# Patient Record
Sex: Male | Born: 1950 | Race: White | Hispanic: No | Marital: Married | Smoking: Never smoker
Health system: Southern US, Community
[De-identification: ages and names within clinical notes are randomized; demographics above are authoritative.]

## PROBLEM LIST (undated history)

## (undated) DIAGNOSIS — H269 Unspecified cataract: Secondary | ICD-10-CM

## (undated) DIAGNOSIS — E079 Disorder of thyroid, unspecified: Secondary | ICD-10-CM

## (undated) DIAGNOSIS — IMO0001 Reserved for inherently not codable concepts without codable children: Secondary | ICD-10-CM

## (undated) DIAGNOSIS — E119 Type 2 diabetes mellitus without complications: Secondary | ICD-10-CM

## (undated) DIAGNOSIS — I8390 Asymptomatic varicose veins of unspecified lower extremity: Secondary | ICD-10-CM

## (undated) DIAGNOSIS — E039 Hypothyroidism, unspecified: Secondary | ICD-10-CM

## (undated) DIAGNOSIS — R7302 Impaired glucose tolerance (oral): Secondary | ICD-10-CM

## (undated) DIAGNOSIS — I2510 Atherosclerotic heart disease of native coronary artery without angina pectoris: Secondary | ICD-10-CM

## (undated) DIAGNOSIS — E785 Hyperlipidemia, unspecified: Secondary | ICD-10-CM

## (undated) DIAGNOSIS — H9311 Tinnitus, right ear: Secondary | ICD-10-CM

## (undated) DIAGNOSIS — IMO0002 Reserved for concepts with insufficient information to code with codable children: Secondary | ICD-10-CM

## (undated) DIAGNOSIS — Z87442 Personal history of urinary calculi: Secondary | ICD-10-CM

## (undated) DIAGNOSIS — M1990 Unspecified osteoarthritis, unspecified site: Secondary | ICD-10-CM

## (undated) DIAGNOSIS — L723 Sebaceous cyst: Secondary | ICD-10-CM

## (undated) DIAGNOSIS — H9190 Unspecified hearing loss, unspecified ear: Secondary | ICD-10-CM

## (undated) HISTORY — PX: FRACTURE SURGERY: SHX138

## (undated) HISTORY — DX: Asymptomatic varicose veins of unspecified lower extremity: I83.90

## (undated) HISTORY — DX: Unspecified osteoarthritis, unspecified site: M19.90

## (undated) HISTORY — PX: JOINT REPLACEMENT: SHX530

## (undated) HISTORY — PX: CARDIAC CATHETERIZATION: SHX172

## (undated) HISTORY — PX: REPAIR PERONEAL TENDONS ANKLE: SUR1201

## (undated) HISTORY — DX: Unspecified hearing loss, unspecified ear: H91.90

## (undated) HISTORY — DX: Disorder of thyroid, unspecified: E07.9

## (undated) HISTORY — DX: Hyperlipidemia, unspecified: E78.5

## (undated) HISTORY — PX: SHOULDER SURGERY: SHX246

## (undated) HISTORY — PX: TOTAL KNEE ARTHROPLASTY: SHX125

## (undated) HISTORY — DX: Unspecified cataract: H26.9

## (undated) HISTORY — DX: Atherosclerotic heart disease of native coronary artery without angina pectoris: I25.10

## (undated) HISTORY — DX: Impaired glucose tolerance (oral): R73.02

## (undated) HISTORY — DX: Reserved for inherently not codable concepts without codable children: IMO0001

## (undated) HISTORY — PX: SHOULDER HARDWARE REMOVAL: SUR1131

## (undated) HISTORY — DX: Reserved for concepts with insufficient information to code with codable children: IMO0002

## (undated) HISTORY — PX: REVISION TOTAL KNEE ARTHROPLASTY: SHX767

---

## 1968-10-24 HISTORY — PX: MULTIPLE TOOTH EXTRACTIONS: SHX2053

## 1998-02-24 ENCOUNTER — Ambulatory Visit (HOSPITAL_BASED_OUTPATIENT_CLINIC_OR_DEPARTMENT_OTHER): Admission: RE | Admit: 1998-02-24 | Discharge: 1998-02-24 | Payer: Self-pay | Admitting: Orthopedic Surgery

## 1998-04-06 ENCOUNTER — Ambulatory Visit (HOSPITAL_COMMUNITY): Admission: RE | Admit: 1998-04-06 | Discharge: 1998-04-06 | Payer: Self-pay | Admitting: *Deleted

## 2000-06-15 ENCOUNTER — Encounter: Payer: Self-pay | Admitting: Orthopedic Surgery

## 2000-06-20 ENCOUNTER — Encounter (INDEPENDENT_AMBULATORY_CARE_PROVIDER_SITE_OTHER): Payer: Self-pay

## 2000-06-20 ENCOUNTER — Ambulatory Visit (HOSPITAL_COMMUNITY): Admission: RE | Admit: 2000-06-20 | Discharge: 2000-06-20 | Payer: Self-pay | Admitting: *Deleted

## 2000-06-21 ENCOUNTER — Inpatient Hospital Stay (HOSPITAL_COMMUNITY): Admission: RE | Admit: 2000-06-21 | Discharge: 2000-06-27 | Payer: Self-pay | Admitting: Orthopedic Surgery

## 2001-04-08 ENCOUNTER — Inpatient Hospital Stay (HOSPITAL_COMMUNITY): Admission: EM | Admit: 2001-04-08 | Discharge: 2001-04-09 | Payer: Self-pay | Admitting: Family Medicine

## 2003-04-16 ENCOUNTER — Encounter: Payer: Self-pay | Admitting: *Deleted

## 2003-04-16 ENCOUNTER — Inpatient Hospital Stay (HOSPITAL_COMMUNITY): Admission: AD | Admit: 2003-04-16 | Discharge: 2003-04-17 | Payer: Self-pay | Admitting: *Deleted

## 2004-06-07 ENCOUNTER — Emergency Department (HOSPITAL_COMMUNITY): Admission: EM | Admit: 2004-06-07 | Discharge: 2004-06-07 | Payer: Self-pay | Admitting: Emergency Medicine

## 2004-06-17 ENCOUNTER — Ambulatory Visit (HOSPITAL_COMMUNITY): Admission: RE | Admit: 2004-06-17 | Discharge: 2004-06-17 | Payer: Self-pay | Admitting: Orthopedic Surgery

## 2004-12-07 ENCOUNTER — Ambulatory Visit: Payer: Self-pay | Admitting: Gastroenterology

## 2004-12-27 ENCOUNTER — Ambulatory Visit: Payer: Self-pay | Admitting: Gastroenterology

## 2005-02-11 ENCOUNTER — Ambulatory Visit: Payer: Self-pay | Admitting: Gastroenterology

## 2006-09-14 ENCOUNTER — Inpatient Hospital Stay (HOSPITAL_COMMUNITY): Admission: EM | Admit: 2006-09-14 | Discharge: 2006-09-16 | Payer: Self-pay | Admitting: Emergency Medicine

## 2006-09-15 ENCOUNTER — Encounter (INDEPENDENT_AMBULATORY_CARE_PROVIDER_SITE_OTHER): Payer: Self-pay | Admitting: Specialist

## 2006-09-15 ENCOUNTER — Encounter (INDEPENDENT_AMBULATORY_CARE_PROVIDER_SITE_OTHER): Payer: Self-pay | Admitting: *Deleted

## 2006-09-22 ENCOUNTER — Ambulatory Visit: Payer: Self-pay | Admitting: Gastroenterology

## 2007-08-28 ENCOUNTER — Emergency Department (HOSPITAL_COMMUNITY): Admission: EM | Admit: 2007-08-28 | Discharge: 2007-08-28 | Payer: Self-pay | Admitting: Emergency Medicine

## 2007-09-11 ENCOUNTER — Ambulatory Visit: Payer: Self-pay

## 2007-09-11 ENCOUNTER — Ambulatory Visit: Payer: Self-pay | Admitting: Cardiology

## 2008-04-14 ENCOUNTER — Ambulatory Visit: Payer: Self-pay | Admitting: Family Medicine

## 2008-04-14 ENCOUNTER — Encounter (INDEPENDENT_AMBULATORY_CARE_PROVIDER_SITE_OTHER): Payer: Self-pay | Admitting: *Deleted

## 2008-04-14 ENCOUNTER — Inpatient Hospital Stay (HOSPITAL_COMMUNITY): Admission: EM | Admit: 2008-04-14 | Discharge: 2008-04-17 | Payer: Self-pay | Admitting: Emergency Medicine

## 2008-04-15 ENCOUNTER — Encounter (INDEPENDENT_AMBULATORY_CARE_PROVIDER_SITE_OTHER): Payer: Self-pay | Admitting: *Deleted

## 2008-04-23 ENCOUNTER — Ambulatory Visit (HOSPITAL_COMMUNITY): Admission: RE | Admit: 2008-04-23 | Discharge: 2008-04-23 | Payer: Self-pay | Admitting: Orthopedic Surgery

## 2008-05-19 ENCOUNTER — Encounter: Admission: RE | Admit: 2008-05-19 | Discharge: 2008-05-19 | Payer: Self-pay | Admitting: Orthopedic Surgery

## 2008-07-25 ENCOUNTER — Inpatient Hospital Stay (HOSPITAL_COMMUNITY): Admission: RE | Admit: 2008-07-25 | Discharge: 2008-07-28 | Payer: Self-pay | Admitting: Orthopedic Surgery

## 2010-06-03 ENCOUNTER — Emergency Department (HOSPITAL_COMMUNITY)
Admission: EM | Admit: 2010-06-03 | Discharge: 2010-06-03 | Payer: Self-pay | Source: Home / Self Care | Admitting: Family Medicine

## 2010-08-04 ENCOUNTER — Encounter: Payer: Self-pay | Admitting: Gastroenterology

## 2010-08-04 ENCOUNTER — Telehealth: Payer: Self-pay | Admitting: Gastroenterology

## 2010-08-05 ENCOUNTER — Encounter (INDEPENDENT_AMBULATORY_CARE_PROVIDER_SITE_OTHER): Payer: Self-pay | Admitting: *Deleted

## 2010-08-12 DIAGNOSIS — E119 Type 2 diabetes mellitus without complications: Secondary | ICD-10-CM

## 2010-08-12 DIAGNOSIS — K219 Gastro-esophageal reflux disease without esophagitis: Secondary | ICD-10-CM | POA: Insufficient documentation

## 2010-08-12 DIAGNOSIS — K449 Diaphragmatic hernia without obstruction or gangrene: Secondary | ICD-10-CM | POA: Insufficient documentation

## 2010-08-12 DIAGNOSIS — E7800 Pure hypercholesterolemia, unspecified: Secondary | ICD-10-CM | POA: Insufficient documentation

## 2010-08-12 DIAGNOSIS — E039 Hypothyroidism, unspecified: Secondary | ICD-10-CM | POA: Insufficient documentation

## 2010-08-12 DIAGNOSIS — K921 Melena: Secondary | ICD-10-CM | POA: Insufficient documentation

## 2010-08-13 DIAGNOSIS — K859 Acute pancreatitis without necrosis or infection, unspecified: Secondary | ICD-10-CM

## 2010-08-17 ENCOUNTER — Ambulatory Visit: Payer: Self-pay | Admitting: Gastroenterology

## 2010-08-17 ENCOUNTER — Encounter (INDEPENDENT_AMBULATORY_CARE_PROVIDER_SITE_OTHER): Payer: Self-pay | Admitting: *Deleted

## 2010-08-17 DIAGNOSIS — R195 Other fecal abnormalities: Secondary | ICD-10-CM

## 2010-08-17 DIAGNOSIS — K7689 Other specified diseases of liver: Secondary | ICD-10-CM

## 2010-08-17 DIAGNOSIS — M129 Arthropathy, unspecified: Secondary | ICD-10-CM | POA: Insufficient documentation

## 2010-08-20 ENCOUNTER — Ambulatory Visit: Payer: Self-pay | Admitting: Gastroenterology

## 2010-08-24 ENCOUNTER — Encounter: Payer: Self-pay | Admitting: Gastroenterology

## 2010-11-25 NOTE — Letter (Signed)
Summary: Patient Notice- Polyp Results  [facility]  [address] [place], [place] [address]   Phone: [phone]  Fax: [phone]        August 24, 2010 MRN: [id number]    [name] [address] [place], [place]  [address]    Dear [name],  I am pleased to inform you that the colon polyp(s) removed during your recent colonoscopy was (were) found to be benign (no cancer detected) upon pathologic examination.  I recommend you have a repeat colonoscopy examination in 10_ years to look for recurrent polyps, as having colon polyps increases your risk for having recurrent polyps or even colon cancer in the future.  Should you develop new or worsening symptoms of abdominal pain, bowel habit changes or bleeding from the rectum or bowels, please schedule an evaluation with either your primary care physician or with me.  Additional information/recommendations:  _x_ No further action with gastroenterology is needed at this time. Please      follow-up with your primary care physician for your other healthcare      needs.  __ Please call [phone] to schedule a return visit to review your      situation.  __ Please keep your follow-up visit as already scheduled.  __ Continue treatment plan as outlined the day of your exam.  Please call us if you are having persistent problems or have questions about your condition that have not been fully answered at this time.  Sincerely,  Mardella Layman MD [facility]  This letter has been electronically signed by your physician.  Appended Document: Patient Notice- Polyp Results letter mailed

## 2010-11-25 NOTE — Procedures (Signed)
Summary: Endoscopy & Pathology   EGD  Procedure date:  09/15/2006  Findings:      Location: [facility]   NAME:  [name], [name]                 ACCOUNT NO.:  [id number]   MEDICAL RECORD NO.:  [id number]          PATIENT TYPE:  INP   LOCATION:  5703                         FACILITY:  [facility]   PHYSICIAN:  Georgiana Spinner, M.D.    DATE OF BIRTH:  May 27, 1951   DATE OF PROCEDURE:  DATE OF DISCHARGE:                                 OPERATIVE REPORT   PROCEDURE:  Upper endoscopy with biopsy.   INDICATIONS:  Abdominal pain.   ANESTHESIA:  Fentanyl 50 mcg, Versed 5 mg.   DESCRIPTION OF PROCEDURE:  With the patient mildly sedated in the left  lateral decubitus position, the Olympus videoscopic endoscope was inserted  in the mouth, passed under direct vision through the esophagus which  appeared normal into the stomach fundus, body, antrum, duodenal bulb were  visualized.  The duodenal bulb proximally and posteriorly appeared somewhat  thickened.  This was photographed and subsequently biopsied.  Second portion  of duodenum appeared normal.  From this point the endoscope was slowly  withdrawn taking circumferential views of duodenal mucosa until the  endoscope had been pulled back into the stomach, placed in retroflexion to  view the stomach from below.  The endoscope was then straightened and  withdrawn taking circumferential views of remaining gastric and esophageal  mucosa.  Subsequently the Olympus videoscopic side-viewing duodenoscope was  then inserted in the mouth.  It too was then passed through the esophagus  into the stomach and subsequently to the duodenum where the thickening of  the duodenum was once again seen and photographed and biopsied on direct  view.  We were able to advance past this to the second portion of the  duodenum.  The ampulla was seen, appeared normal, was not photographed but  was viewed.  The endoscope was then withdrawn.  The patient's vital signs  and pulse oximeter remained stable.  The patient tolerated the procedure  well without apparent complications.   FINDINGS:  Focal thickening of the duodenal bulb, but otherwise an  unremarkable examination.  Biopsies were taken.  Await biopsy report.  The  patient reports clinical improvement since being in the hospital.  Continue  present therapy with PPIs and advance diet as tolerated.  Observe clinical  response.           ______________________________  Georgiana Spinner, M.D.     GMO/MEDQ  D:  09/15/2006  T:  09/15/2006  Job:  [id number]   cc:   [name], M.D.      SP Surgical Pathology - STATUS: Final             By: Morrie Sheldon,       Perform Date: [id number] 00:01  Ordered By: Jarvis Newcomer,            Ordered Date: [phone] 14:39  Facility: [facility]  Department: CPATH  Service Report Text  [facility]   [address]   [place], [place] [address]   [phone]    REPORT OF SURGICAL PATHOLOGY    Case #: [id number]   Patient Name: [name], [name].   Office Chart Number: N/A    MRN: [id number]   Pathologist: Beulah Gandy. Luisa Hart, MD   DOB/Age [birth year] (Age: 60) Gender: M   Date Taken: 09/15/2006   Date Received: 09/15/2006    FINAL DIAGNOSIS    ***MICROSCOPIC EXAMINATION AND DIAGNOSIS***    DUODENAL BIOPSIES: BENIGN GASTRIC TYPE MUCOSA. SEE COMMENT.    COMMENT   The biopsy fragments consist of benign gastric type glandular   mucosa with mild chronic inflammation. 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 No dysplasia or malignancy is identified.   JDP:mw 09/18/06    mw   Date Reported: 09/18/2006 John D. Luisa Hart, MD   *** Electronically Signed Out By JDP ***    Clinical information   (as)    specimen(s) obtained   Duodenum, biopsy    Gross Description   Received in formalin are tan, soft tissue fragments that are   submitted in toto.  Number: Two   Size: 0.1 and 0.2 cm One block (SW:mj 09/15/06)    mj/

## 2010-11-25 NOTE — Procedures (Signed)
Summary: Colonoscopy  Patient: [name] Note: All result statuses are Final unless otherwise noted.  Tests: (1) Colonoscopy (COL)   COL Colonoscopy           DONE     [facility]     [address]     [place], [place]  [address]           COLONOSCOPY PROCEDURE REPORT           PATIENT:  Jeff, Freeman  MR#:  [id number]     BIRTHDATE:  [birth year], 59 yrs. old  GENDER:  male     ENDOSCOPIST:  Vania Rea. Jarold Motto, MD, [facility]     REF. BY:  Robert Bellow, M.D.     PROCEDURE DATE:  08/20/2010     PROCEDURE:  Colonoscopy with biopsy     ASA CLASS:  Class II     INDICATIONS:  FOBT positive stool     MEDICATIONS:   Fentanyl 75 mcg IV, Versed 8 mg IV           DESCRIPTION OF PROCEDURE:   After the risks benefits and     alternatives of the procedure were thoroughly explained, informed     consent was obtained.  Digital rectal exam was performed and     revealed no abnormalities.   The [id number] [id number] endoscope was     introduced through the anus and advanced to the cecum, which was     identified by both the appendix and ileocecal valve, without     limitations.  The quality of the prep was excellent, using     MoviPrep.  The instrument was then slowly withdrawn as the colon     was fully examined.     <<PROCEDUREIMAGES>>           FINDINGS:  Mild diverticulosis was found in the sigmoid to     descending colon segments.  A diminutive polyp was found in the     rectum. COLD BIOPSY REMOVED.   Retroflexed views in the rectum     revealed no abnormalities.    The scope was then withdrawn from     the patient and the procedure completed.           COMPLICATIONS:  None     ENDOSCOPIC IMPRESSION:     1) Mild diverticulosis in the sigmoid to descending colon     segments     2) Diminutive polyp in the rectum     R/O ADENOMA     RECOMMENDATIONS:     1) Await biopsy results     2) Repeat colonoscopy in 5 years if polyp adenomatous; otherwise     10 years     3) High fiber diet.  EGD NEEDED AND PERHAPS PILL CAMERA EXAM.     REPEAT EXAM:  No           ______________________________     Vania Rea. Jarold Motto, MD, [facility]           CC:           n.     eSIGNED:   Vania Rea. Patterson at 08/20/2010 02:32 PM           Francesco Runner, [id number]  Note: An exclamation mark (!) indicates a result that was not dispersed into the flowsheet. Document Creation Date: 08/20/2010 2:32 PM _______________________________________________________________________  (1) Order result status: Final Collection or observation date-time: 08/20/2010 14:22 Requested date-time:  Receipt date-time:  Reported  date-time:  Referring Physician:   Ordering Physician: Sheryn Bison [phone]) Specimen Source:  Source: Launa Grill Order Number: [id number] Lab site:   Appended Document: Colonoscopy 10 year followup  Appended Document: Colonoscopy     Procedures Next Due Date:    Colonoscopy: 07/2020

## 2010-11-25 NOTE — Letter (Signed)
Summary: New Patient letter  [facility]  [address] [place], [place] [address]   Phone: [phone]  Fax: [phone]       08/05/2010 MRN: [id number]  [name] [address] [place], [place]  [address]  Dear [name],  Welcome to the Gastroenterology Division at [facility].    You are scheduled to see Dr.  Jarold Motto on 08/17/2010 at 1:30pm on the 3rd floor at [facility], [address]  We ask that you try to arrive at our office 15 minutes prior to your appointment time to allow for check-in.  We would like you to complete the enclosed self-administered evaluation form prior to your visit and bring it with you on the day of your appointment.  We will review it with you.  Also, please bring a complete list of all your medications or, if you prefer, bring the medication bottles and we will list them.  Please bring your insurance card so that we may make a copy of it.  If your insurance requires a referral to see a specialist, please bring your referral form from your primary care physician.  Co-payments are due at the time of your visit and may be paid by cash, check or credit card.     Your office visit will consist of a consult with your physician (includes a physical exam), any laboratory testing he/she may order, scheduling of any necessary diagnostic testing (e.g. x-ray, ultrasound, CT-scan), and scheduling of a procedure (e.g. Endoscopy, Colonoscopy) if required.  Please allow enough time on your schedule to allow for any/all of these possibilities.    If you cannot keep your appointment, please call [phone] to cancel or reschedule prior to your appointment date.  This allows [place] the opportunity to schedule an appointment for another patient in need of care.  If you do not cancel or reschedule by 5 p.m. the business day prior to your appointment date, you will be charged a $50.00 late cancellation/no-show fee.    Thank you for choosing  [facility] for your medical needs.  We appreciate the opportunity to care for you.  Please visit [place] at our website  to learn more about our practice.                     Sincerely,                                                             The Gastroenterology Division

## 2010-11-25 NOTE — Progress Notes (Signed)
Summary: Colon recall  Phone Note Call from Patient Call back at [facility] [phone]   Caller: [facility] Summary of Call: Pt would like to know when next colon is due.  No recall in IDX.  Last colon in 12/2004.  Chart has been requested. Initial call taken by: Francee Piccolo CMA Duncan Dull),  August 04, 2010 12:18 PM  Follow-up for Phone Call        left message for pt to call back she is at lunch. Follow-up by: Harlow Mares CMA Duncan Dull),  August 05, 2010 1:48 PM  Additional Follow-up for Phone Call Additional follow up Details #1::        patients wife aware he is due 12/2014 i will add it to the flowsheet and idx Additional Follow-up by: Harlow Mares CMA Duncan Dull),  August 05, 2010 4:42 PM     Appended Document: Colon recall patients wife called back and states at his yearly physical with D.r Guest the patient had blood in his stool I advised her that if that is the case he will need an office visit there was only one slot open until December so I have made him an appt for 08/17/2010 and if for some reason they dont need the appt they will call back and cx.

## 2010-11-25 NOTE — Assessment & Plan Note (Signed)
Summary: [name]   History of Present Illness Visit Type: consult Primary GI MD: Sheryn Bison MD FACP FAGA Primary Provider: Robert Bellow, MD Requesting Provider: Robert Bellow, MD Chief Complaint: heme + stool, no visible blood that pt has seen History of Present Illness:   [name] is 60 year old white male who had guaiac positive stools on routine Hemoccult testing. He has mild constipation but denies other gastrointestinal symptoms except for occasional GERD. He had endoscopy and colonoscopy in 2006. Several years ago he had severe abdominal pain and apparently passed a gallstone although previous ultrasounds have been normal. He also has a history of obesity and fatty infiltration of his liver, but review of his labs show recent normal CBC and liver profile.  He is on daily aspirin, metformin, and zetia for hyperlipidemia. He has had bilateral knee replacements in the past. He has a history of noncritical coronary artery disease. He denies current cardiopulmonary or hepatobiliary symptomatology.   GI Review of Systems    Reports abdominal pain, acid reflux, and  heartburn.      Denies belching, bloating, Freeman pain, dysphagia with liquids, dysphagia with solids, loss of appetite, nausea, vomiting, vomiting blood, weight loss, and  weight gain.      Reports diarrhea and  liver problems.     Denies anal fissure, black tarry stools, change in bowel habit, constipation, diverticulosis, fecal incontinence, heme positive stool, hemorrhoids, irritable bowel syndrome, jaundice, light color stool, rectal bleeding, and  rectal pain.    Current Medications (verified): 1)  Crestor 10 Mg Tabs (Rosuvastatin Calcium) .... Take 1/2 Tablet By Mouth At Bedtime 2)  Fish Oil 1200 Mg Caps (Omega-3 Fatty Acids) 3)  Aspirin 81 Mg Tbec (Aspirin) .... Take One By Mouth Once Daily 4)  Metformin Hcl 500 Mg Tabs (Metformin Hcl) .... Take One By Mouth Two Times A Day 5)  Synthroid 150 Mcg Tabs  (Levothyroxine Sodium) .... Take One By Mouth Once Daily 6)  Zetia 10 Mg Tabs (Ezetimibe) .... Take One By Mouth Once Daily  Allergies: 1)  Lipitor  Past History:  Past medical, surgical, family and social histories (including risk factors) reviewed for relevance to current acute and chronic problems.  Past Medical History: Arthritis Diabetes Fatty Liver Disease GERD Hyperlipidemia Hypothyroidism Obesity Pancreatitis  Past Surgical History: Knee Replacement-Bilateral shoulder surgery  Family History: Reviewed history from 08/12/2010 and no changes required. Family History of Diabetes: "everybody" Family History of Heart Disease: Mother, Father  Social History: Reviewed history from 08/12/2010 and no changes required. married 2 boys, 3 girls Patient has never smoked.  Alcohol Use - no Daily Caffeine Use 2 cups/day Illicit Drug Use - no Occupation: fixer-maintenance tech Patient gets regular exercise. walk  Review of Systems       The patient complains of allergy/sinus, arthritis/joint pain, back pain, headaches-new, hearing problems, muscle pains/cramps, swelling of feet/legs, urine leakage, and vision changes.  The patient denies anemia, anxiety-new, blood in urine, breast changes/lumps, confusion, cough, coughing up blood, depression-new, fainting, fatigue, fever, heart murmur, heart rhythm changes, itching, night sweats, nosebleeds, shortness of breath, skin rash, sleeping problems, sore throat, swollen lymph glands, thirst - excessive, urination - excessive, urination changes/pain, and voice change.    Vital Signs:  Patient profile:   60 year old male Height:      71 inches Weight:      271 pounds BMI:     37.93 Pulse rate:   80 / minute Pulse rhythm:   regular BP sitting:  100 / 66  (left arm) Cuff size:   large  Vitals Entered By: Francee Piccolo CMA Duncan Dull) (August 17, 2010 1:23 PM)  Physical Exam  General:  Well developed, well nourished, no acute  distress.healthy appearing.  healthy appearing.   Head:  Normocephalic and atraumatic. Eyes:  PERRLA, no icterus.exam deferred to patient's ophthalmologist.  exam deferred to patient's ophthalmologist.   Lungs:  Clear throughout to auscultation. Heart:  Regular rate and rhythm; no murmurs, rubs,  or bruits. Abdomen:  Soft, nontender and nondistended. No masses, hepatosplenomegaly or hernias noted. Normal bowel sounds.obese.  obese.   Rectal:  deferred until time of colonoscopy.  deferred until time of colonoscopy.   Msk:  joint tenderness, decreased ROM, and arthritic changes.  joint tenderness, decreased ROM, and arthritic changes.   Extremities:  No clubbing, cyanosis, edema or deformities noted.1+ pedal edema.  1+ pedal edema.   Neurologic:  Alert and  oriented x4;  grossly normal neurologically. Skin:  Intact without significant lesions or rashes. Cervical Nodes:  No significant cervical adenopathy. Psych:  Alert and cooperative. Normal mood and affect.   Impression & Recommendations:  Problem # 1:  HEMOCCULT POSITIVE STOOL (ICD-578.1) Assessment Unchanged Colonoscopy scheduled his convenience. He does describe intermittent rectal fissures but no activity at this time. Review of his labs showed normal CBC. Family history is noncontributory.  Problem # 2:  ARTHRITIS (ICD-716.90) Assessment: Improved he denies abuse of NSAIDs and is on aspirin a 81 mg a day.  Problem # 3:  FATTY LIVER DISEASE (ICD-571.8) Assessment: Improved normal LFTs. His  liver does not appear to be significantly enlarged on exam.  Problem # 4:  GERD (ICD-530.81) Assessment: Improved he uses p.r.n. H2 blockers with good symptomatic relief. Previous endoscopy did not show evidence of Barrett's mucosa.  Problem # 5:  DIABETES MELLITUS (ICD-250.00) Assessment: Improved Appropriate change in his diabetic medications made for his colon prep and colonoscopy exam.  Other Orders: Colonoscopy (Colon)  Patient  Instructions: 1)  Copy sent to : Robert Bellow, MD 2)  Your prescription(s) have been sent to you pharmacy.  3)  Your procedure has been scheduled for 08/20/2010, please follow the seperate instructions.  4)   Endoscopy Center Patient Information Guide given to patient.  5)  Colonoscopy and Flexible Sigmoidoscopy brochure given.  6)  The medication list was reviewed and reconciled.  All changed / newly prescribed medications were explained.  A complete medication list was provided to the patient / caregiver. Prescriptions: MOVIPREP 100 GM  SOLR (PEG-KCL-NACL-NASULF-NA ASC-C) As per prep instructions.  #1 x 0   Entered by:   Harlow Mares CMA (AAMA)   Authorized by:   Mardella Layman MD [facility]   Signed by:   Mardella Layman MD [facility] on 08/17/2010   Method used:   Electronically to        [facility] [phone]* (retail)       [address]       [place], [place]  [address]       Ph: [phone] or [phone]       Fax: [phone]   RxID:   [id number]

## 2010-11-25 NOTE — Letter (Signed)
Summary: [facility] Instructions  [facility]  [address] [place], [place] [address]   Phone: [phone]  Fax: [phone]       [name]    1950-12-16    MRN: [id number]       Procedure Day /Date: 08/20/2010 Friday     Arrival Time: 1:00pm     Procedure Time: 2:00pm     Location of Procedure:                    X  [facility] (4th Floor)   PREPARATION FOR COLONOSCOPY WITH MOVIPREP   Starting 5 days prior to your procedure TODAY do not eat nuts, seeds, popcorn, corn, beans, peas,  salads, or any raw vegetables.  Do not take any fiber supplements (e.g. Metamucil, Citrucel, and Benefiber).  THE DAY BEFORE YOUR PROCEDURE         DATE: 08/19/2076  DAY: Thursday  1.  Drink clear liquids the entire day-NO SOLID FOOD  2.  Do not drink anything colored red or purple.  Avoid juices with pulp.  No orange juice.  3.  Drink at least 64 oz. (8 glasses) of fluid/clear liquids during the day to prevent dehydration and help the prep work efficiently.  CLEAR LIQUIDS INCLUDE: Water Jello Ice Popsicles Tea (sugar ok, no milk/cream) Powdered fruit flavored drinks Coffee (sugar ok, no milk/cream) Gatorade Juice: apple, white grape, white cranberry  Lemonade Clear bullion, consomm, broth Carbonated beverages (any kind) Strained chicken noodle soup Hard Candy                             4.  In the morning, mix first dose of MoviPrep solution:    Empty 1 Pouch A and 1 Pouch B into the disposable container    Add lukewarm drinking water to the top line of the container. Mix to dissolve    Refrigerate (mixed solution should be used within 24 hrs)  5.  Begin drinking the prep at 5:00 p.m. The MoviPrep container is divided by 4 marks.   Every 15 minutes drink the solution down to the next mark (approximately 8 oz) until the full liter is complete.   6.  Follow completed prep with 16 oz of clear liquid of your choice (Nothing red or purple).  Continue  to drink clear liquids until bedtime.  7.  Before going to bed, mix second dose of MoviPrep solution:    Empty 1 Pouch A and 1 Pouch B into the disposable container    Add lukewarm drinking water to the top line of the container. Mix to dissolve    Refrigerate  THE DAY OF YOUR PROCEDURE      DATE: 08/20/2010  DAY: Friday  Beginning at 9:00am (5 hours before procedure):         1. Every 15 minutes, drink the solution down to the next mark (approx 8 oz) until the full liter is complete.  2. Follow completed prep with 16 oz. of clear liquid of your choice.    3. You may drink clear liquids until 12:00(noon) (2 HOURS BEFORE PROCEDURE).   MEDICATION INSTRUCTIONS  Unless otherwise instructed, you should take regular prescription medications with a small sip of water   as early as possible the morning of your procedure.  Diabetic patients - see separate instructions.      OTHER INSTRUCTIONS  You will need a responsible adult at least 62  years of age to accompany you and drive you home.   This person must remain in the waiting room during your procedure.  Wear loose fitting clothing that is easily removed.  Leave jewelry and other valuables at home.  However, you may wish to bring a book to read or  an iPod/MP3 player to listen to music as you wait for your procedure to start.  Remove all body piercing jewelry and leave at home.  Total time from sign-in until discharge is approximately 2-3 hours.  You should go home directly after your procedure and rest.  You can resume normal activities the  day after your procedure.  The day of your procedure you should not:   Drive   Make legal decisions   Operate machinery   Drink alcohol   Return to work  You will receive specific instructions about eating, activities and medications before you leave.    The above instructions have been reviewed and explained to me by   _______________________    I fully understand  and can verbalize these instructions _____________________________ Date _________

## 2010-11-25 NOTE — Procedures (Signed)
Summary: Colonoscopy   Colonoscopy  Procedure date:  12/27/2004  Findings:      Location:  [facility].   Patient Name: [name], [name] MRN:  Procedure Procedures: Colonoscopy CPT: [phone].  Personnel: Endoscopist: Vania Rea. Jarold Motto, MD.  Exam Location: Exam performed in Outpatient Clinic. Outpatient  Patient Consent: Procedure, Alternatives, Risks and Benefits discussed, consent obtained, from patient. Consent was obtained by the RN.  Indications Symptoms: Abdominal pain / bloating.  History  Current Medications: Patient is not currently taking Coumadin.  Pre-Exam Physical: Performed Dec 27, 2004. Cardio-pulmonary exam, Rectal exam, Abdominal exam, Extremity exam, Mental status exam WNL.  Exam Exam: Extent of exam reached: Cecum, extent intended: Cecum.  The cecum was identified by appendiceal orifice and IC valve. Patient position: on left side. Duration of exam: 20 minutes. Colon retroflexion performed. Images taken. ASA Classification: II. Tolerance: excellent.  Monitoring: Pulse and BP monitoring, Oximetry used. Supplemental O2 given. at 2 Liters.  Colon Prep Used Golytely for colon prep. Prep results: excellent.  Sedation Meds: Patient assessed and found to be appropriate for moderate (conscious) sedation. Fentanyl 75 mcg. given IV. Versed 9 mg. given IV.  Instrument(s): CF 140L. Serial [id number].  Findings - NORMAL EXAM: Cecum to Rectum. Not Seen: Polyps. AVM's. Colitis. Tumors. Melanosis. Crohn's. Diverticulosis. Hemorrhoids.   Assessment Normal examination.  Events  Unplanned Interventions: No intervention was required.  Plans Medication Plan: Referring Shaylea Ucci to order medications.  Disposition: After procedure patient sent to recovery. After recovery patient sent home.  Scheduling/Referral: EGD, to [facility]. Jarold Motto, MD, on Dec 27, 2004.   Comments: RUQ pain seems to be associated with a fatty liver Dx.   CC: [name], MD     Rosalyn Gess. Norins, MD     Gaynelle Arabian, MD  This report was created from the original endoscopy report, which was reviewed and signed by the above listed endoscopist.

## 2010-11-25 NOTE — Letter (Signed)
Summary: Diabetic Instructions  [facility]  [address] [place], [place] [address]   Phone: [phone]  Fax: [phone]    [name] July 06, 1951 MRN: [id number]   X   ORAL DIABETIC MEDICATION INSTRUCTIONS   The day before your procedure:   Take your diabetic pill as you do normally  The day of your procedure:   Do not take your diabetic pill    We will check your blood sugar levels during the admission process and again in Recovery before discharging you home

## 2011-01-05 LAB — GLUCOSE, CAPILLARY
Glucose-Capillary: 92 mg/dL (ref 70–99)
Glucose-Capillary: 92 mg/dL (ref 70–99)

## 2011-03-08 NOTE — Procedures (Signed)
[facility]                              EXERCISE TREADMILL   [name], [name]                        MRN:          [id number]  DATE:09/11/2007                            DOB:          November 16, 1950    Mr. [name] is a 60 year old gentleman with nonobstructive coronary  disease.  He had a catheterization in 2004 which showed a 60% followed  by a 50% LAD, 20% proximal right coronary artery, 40% and a PDA off his  right coronary vessel with an EF of 50%.   He had an episode of vertigo and dizziness with some sweating at work.  He thought it was something he ate.  He has been set up for a treadmill  after giving this history to Dr. Perrin Maltese.   His blood pressure today was 122/78, pulse 79 and regular.   He exercised on the standard Bruce protocol.  He went from 8-1/2  minutes, despite knee problems.  He had no ST segment changes.  He had  no symptoms of angina or ischemia.  His blood pressure increased to  192/51.  It came down nicely in recovery.   His peak heart rate was 142 beats per minute, which is 87% of predicted  maximum heart rate.  He did 5.5 mets.   ASSESSMENT:  1. Negative adequate exercise treadmill.  2. Relatively good exercise tolerance.  3. Mildly exaggerated blood pressure response to exercise.   The results were shared with the patient.  He will continue with medical  therapy.     Thomas C. Daleen Squibb, MD, [facility]  Electronically Signed    TCW/MedQ  DD: 09/11/2007  DT: 09/12/2007  Job #: [id number]   cc:   [name], M.D.

## 2011-03-08 NOTE — Op Note (Signed)
NAMEFAIZ, WEBER                 ACCOUNT NO.:  [id number]   MEDICAL RECORD NO.:  [id number]          PATIENT TYPE:  INP   LOCATION:  5037                         FACILITY:  [facility]   PHYSICIAN:  Madlyn Frankel. Charlann Boxer, M.D.  DATE OF BIRTH:  Sep 26, 1951   DATE OF PROCEDURE:  07/25/2008  DATE OF DISCHARGE:                               OPERATIVE REPORT   PREOPERATIVE DIAGNOSIS:  Left knee osteoarthritis.   POSTOPERATIVE DIAGNOSIS:  Left knee osteoarthritis.   PROCEDURE:  Left total knee replacement.   IMPLANTS:  DePuy rotating platform posterior-stabilized knee system with  a size 5 femur, 4 tibia, 12.5 insert to match the 5 femur and a 41  patellar button.   SURGEON:  Madlyn Frankel. Charlann Boxer, MD   ASSISTANT:  Yetta Glassman. Mann, PA   ANESTHESIA:  General plus regional femoral block placed preoperatively.   COMPLICATIONS:  None.   DRAINS:  X1 medium Hemovac.   TOURNIQUET TIME:  45 minutes at 250 mmHg.   INDICATIONS FOR PROCEDURE:  Mr. Strohmeier is a 60 year old gentleman who was  initially seen and evaluated for painful right total knee replacement.  He had progressive worsening of his left knee with evidence  radiographically of end-stage degenerative changes.  Given his failure  to respond to conservative measures, he wished to discuss surgical  intervention.  Despite having some discomfort with his right knee, his  left knee was the most prominent source of pain.  He wished to go ahead  and proceed with total knee replacement.  Risks and benefits were  reviewed including the risks of infection, DVT, as well as persistent  discomfort, limited range of motion and need for revision surgery.  Consent was obtained.   PROCEDURE IN DETAIL:  The patient was brought to the operative theater.  Once adequate anesthesia and preoperative antibiotics, 2 g of Ancef, the  patient was positioned supine with a left thigh tourniquet placed.  Left  lower extremity was pre-scrubbed and prepped and draped in the  sterile  fashion.   Leg was exsanguinated.  Tourniquet was elevated to 250 mmHg.  A midline  incision was made followed by a median arthrotomy.  Following initial  debridement of the meniscus and synovium, attention was directed to the  patella.  Precut measurement was to 24 mm.  I resected 14 mm and used a  41 patellar button, which fit nicely along the cut surface.   The trial patella was kept in place to prevent any injury to the cut  surface of the patella from retractors of the saw.   Attention was now directed to the femur.  The femoral canal was opened  with the drill, irrigated to prevent fat emboli.  An intramedullary rod  was then passed.  Based on preoperative flexion contracture, I chose to  resect 12 mm of bone off the distal femur.   I did this at 3 degrees of valgus.   Following the distal femoral cut, attention was directed to the tibia.  The tibia was subluxated anteriorly.  Further meniscectomies and  cruciate stumps were debrided as the tibia  was delivered anteriorly.  An  extramedullary guide was used to resect 10 mm of bone off the proximal  tibia using extramedullary device.   I checked the cut surface to make sure it was perpendicular both planes.   At this point, attention was redirected back to femur where femoral  rotation was set off the proximal tibial cut.  The rotational block was  placed with a 3-degree valgus rod placed.  Using the anterior stylus,  the block was pinned in position based on the rotation of the  perpendicular cut.   The 41 cutting block was then pinned into position, checked with a crab  claw to make sure there was no evidence of any notching.  The anterior,  posterior, and chamfer cuts were then made without difficulty nor  notching.  Final box cut was made off the lateral aspect of the distal  femur.   The attention was now redirected back to the tibia.  The tibia was  subluxed anteriorly.  Based on osteophytes from proximal  medial tibia, I  chose to use a size 4 femur slightly lateralizing it to get the rotation  that was appropriate.  It was pinned into position.  Further debridement  of medial osteophytes was carried out.   With the trial in place, it was drilled, keel punched and a trial  reduction was carried out with 5 femur, 4 tibia, and size 5 x 12.5  insert.  The knee came to full extension and appeared to stable from  extension to flexion.   The patella was noted to track without any lift-off or without  application of any pressure.   Trial components were removed.  The synovial capsule junction was  injected with 0.25% Marcaine with epinephrine and 1 mL of Toradol, 61 mL  total.   The knee was then irrigated with pulse lavage and normal saline solution  as the cement was mixed.  The final components were then cemented into  position.  The knee was brought to extension while cement cured and  excessive cement removed.   Once the cement cured, excessive cement was removed throughout the knee  posteriorly particularly.  Once I satisfied, there was none visible, the  knee was re-irrigated and the final 5 x 12.5 insert was placed.  The  knee was then irrigated again.  Medium Hemovac drain was placed deep  using #1 Vicryl, reapproximated extensor mechanism  with the knee in flexion.  The remaining wound was closed in layers with  2-0 Vicryl in running 4-0 Monocryl.  The knee was cleaned, dried, and  dressed sterilely with a sterile bulky wrap.  He was brought to the  recovery room in stable condition.      Madlyn Frankel Charlann Boxer, M.D.  Electronically Signed     MDO/MEDQ  D:  07/25/2008  T:  07/26/2008  Job:  [id number]

## 2011-03-08 NOTE — H&P (Signed)
NAMEODEAN, FESTER NO.:  [id number]   MEDICAL RECORD NO.:  [id number]          PATIENT TYPE:  INP   LOCATION:  5120                         FACILITY:  [facility]   PHYSICIAN:  Nestor Ramp, MD        DATE OF BIRTH:  1951-08-31   DATE OF ADMISSION:  04/14/2008  DATE OF DISCHARGE:                              HISTORY & PHYSICAL   CHIEF COMPLAINT:  Abdominal pain.   PRIMARY CARE PHYSICIAN:  [facility].   HISTORY OF PRESENT ILLNESS:  This is a 60 year old gentleman who comes  to the emergency room with abdominal pain.  He and his wife state that  he has had off-and-on abdominal pain for the past couple of months.  However, the pain became severe Saturday and worsened on Sunday.  He has  been unable to eat or drink anything since Sunday afternoon.  The  abdominal pain is very strong, ringing sensation like he said someone is  turning inside out.  He says he never fell landing like this before.  He  states the pain is located throughout the stomach, but worse in the  upper mid area.  He has had right upper quadrant pain before with a  negative workup.  He has had nausea, but no vomiting.  He has had a  couple episodes of mild diarrhea.  No blood or tarry stools.  He has had  subjective fevers over the weekend, that has not taken his temperature.  He has had chills as well.  Of note, he does not drink alcohol.  The  patient was seen at [facility] earlier today where they felt he  had acute abdomen and sent him to the emergency room immediately.   PAST MEDICAL HISTORY:  1. He has coronary artery disease, nonobstructing with a normal stress      test in November 2008 .  2. He has osteoarthritis as well as hyperlipidemia and hypothyroidism.   MEDICATIONS:  He is on a cholesterol medication, unknown name and dose.  He takes Synthroid 150 mcg p.o. daily.  He takes Tylenol Extra Strength  for arthritis.   ALLERGIES:  Allergic to CRESTOR and LIPITOR.   FAMILY HISTORY:   There is no gallbladder or pancreatic disease in the  family; however, there is hypertension and diabetes throughout the  family.   SOCIAL HISTORY:  He is married.  He lives in [place].  He works at  [employer]  He denies alcohol use and never has drank.  He  denies smoking, denies drug use.   SURGICAL HISTORY:  He has had right knee replacement as well as  bilateral shoulder surgery.   REVIEW OF SYSTEMS:  As in HPI with the following additions.  Complains  of occasional headache.  Denies vision changes, denies chest pain,  denies shortness of breath, denies edema, denies rashes, denies  weakness, denies dysuria.  Remaining review of systems is negative.   PHYSICAL EXAMINATION:  VITAL SIGNS:  He is currently afebrile.  Heart  rate ranges from 60s to the 80s, his blood pressure 140s over  70s and  then 120s over 30s, his oxygen is 95 to 100 on room air.  GENERAL EXAM:  He is not in acute distress, although he is diaphoretic.  HEENT:  Pupils equal, round, react to light and accommodation.  Extraocular muscles intact.  No scleral icterus.  TMs are clear  bilaterally.  He has somewhat dry mucous membranes, but no erythema of  his throat.  NECK:  No JVD.  CARDIOVASCULAR:  Regular rate and rhythm.  No rubs, gallops, or murmurs.  PULMONARY:  Clear to auscultation bilaterally.  ABDOMEN:  Soft with a mild-to-moderate tenderness to palpation  diffusely, worse in the epigastric area.  No rebound, no guarding.  Normal bowel sounds.  EXTREMITIES:  He has 1+ nonpitting pedal edema bilaterally.  DP pulses 2+ bilaterally and equal.  NEURO EXAM:  Cranial nerves 2 through 12 intact.  Oriented x3.  No  asterixis.  SKIN:  No rashes or lesions noted.   LABORATORY DATA:  He has an elevated white count of 18.8, hemoglobin  15.4, hematocrit 44, platelets 290, neutrophils 79, ANC 14.9.  Amylase  is elevated at 351.  Lipase is elevated at 268.  LDH is normal at 235.  INR is normal at 1.   Urinalysis is within normal limits.  Point of care  enzymes are negative.  Comprehensive metabolic panel shows sodium 134,  glucose of 122, AST 20, ALT 22, calcium 8.6, creatinine is 0.85.   CAT scan of his abdomen and pelvis shows a slight haziness around the  pancreatic head and second portion of duodenum.  Question early focal  pancreatitis, recommend correlation with enzymes.  There is a normal  appendix.  There was also fatty infiltration of the liver.   ASSESSMENT:  This is a 60 year old man with pancreatitis.   PROBLEM:  1. Pancreatitis:  This is classified as mild pancreatitis with a      ransom score of 2 with a low mortality rate.  We will treat the      patient by keeping him n.p.o.  I have hydrated him aggressively      with D5 normal saline at 200 mL an hour.  We will treat his pain      with morphine.  We will check labs in the morning such as CBC and      BMET which will help [place] further quantify his ransom score after 48      hours.  We will follow the patient clinically.  No imaging is      needed at this point.  However, we may get abdominal ultrasound to      evaluate for gallstones as well as stones in the head of the      pancreas.  The etiology of this patient's pancreatitis is likely      unknown etiology with a microlithiasis versus hypertriglyceridemia.      We will get a fasting lipid panel in the morning to evaluate his      triglycerides.  The patient has no alcohol history and CT did not      show gallstones; however, they could be seen on ultrasound.      However, this is unlikely given his normal bilirubin of 1.  2. Hyperlipidemia:  We did not know the name of his medication.  We      will not resume any cholesterol meds at this point.  However, we      will obtain records from [facility].  3.  Hypothyroidism:  We will continue his Synthroid as home dose.  4. Prophylaxis:  We will put him on PPI and Lovenox.  5. Disposition:  Disposition will  depend on how quickly the patient's      pancreatitis resolves.     Johney Maine, M.D.  Electronically Signed      Nestor Ramp, MD  Electronically Signed   JT/MEDQ  D:  04/14/2008  T:  04/15/2008  Job:  [id number]

## 2011-03-08 NOTE — H&P (Signed)
[name], [name]                 ACCOUNT NO.:  [id number]   MEDICAL RECORD NO.:  [id number]          PATIENT TYPE:  INP   LOCATION:                               FACILITY:  [facility]   PHYSICIAN:  Madlyn Frankel. Charlann Boxer, M.D.  DATE OF BIRTH:  1951-02-19   DATE OF ADMISSION:  07/25/2008  DATE OF DISCHARGE:                              HISTORY & PHYSICAL   PROCEDURE:  Left total knee replacement.   CHIEF COMPLAINT:  Left knee pain.   HISTORY OF PRESENT ILLNESS:  A 60 year old male with a history of left  knee pain secondary to osteoarthritis.  He has been refractory to all  conservative treatment.  He has been presurgically assessed by Dr. Perrin Maltese  for a left total knee replacement.   PAST MEDICAL HISTORY:  Significant for:  1. Osteoarthritis.  2. Hypercholesterolemia.  3. Recurrent urinary tract infections.   PAST SURGICAL HISTORY:  None.   FAMILY HISTORY:  Coronary artery disease and diabetes.   SOCIAL HISTORY:  Married.  Primary care giver will be family in the home  postoperatively.   DRUG ALLERGIES:  No known drug allergies.   MEDICATIONS:  1. Oxycodone 5 mg 1 p.o. q.4-6 h. p.r.n. pain.  2. Zetia 10 mg p.o. daily.  3. Synthroid 150 mcg p.o. daily.  4. Tylenol extra strength daily.  5. Tylenol PM p.r.n.  6. Cinnamon daily.  7. Omega 3 fish oil 1000 mg daily.  8. Vitamin D 3000 units p.o. daily.  9. Celebrex 200 mg 1 p.o. b.i.d. x2 weeks after surgery.   REVIEW OF SYSTEMS:  See HPI.   PHYSICAL EXAMINATION:  VITAL SIGNS:  Pulse 72, respirations 16, and  blood pressure 124/80.  GENERAL:  Awake, alert, and oriented, well developed, well nourished, in  no acute distress.  NECK:  Supple.  No carotid bruits.  CHEST:  Lungs clear to auscultation bilaterally.  BREASTS:  Deferred.  HEART:  Regular rate and rhythm.  S1 and S2 distinct.  ABDOMEN:  Soft, nontender, and nondistended.  Bowel sounds present.  GENITOURINARY:  Deferred.  EXTREMITIES:  Left knee 0-120 degrees, diffuse  tenderness.  SKIN:  No cellulitis.  NEUROLOGIC:  Intact distal sensibilities.   LABORATORY DATA:  EKG and chest x-ray are pending presurgical testing.   IMPRESSION:  Left knee osteoarthritis.   PLAN OF ACTION:  Left total knee replacement at [facility] on  July 25, 2008, by surgeon Dr. Durene Romans.  Risk and complications  were discussed.   Postoperative medications including Lovenox, Robaxin, iron, aspirin,  Colace, MiraLax provided at the time of history and physical.  Celebrex  will be utilized perioperatively as well.     ______________________________  Yetta Glassman. [address], [place]      Madlyn Frankel. Charlann Boxer, M.D.  Electronically Signed    BLM/MEDQ  D:  07/21/2008  T:  07/22/2008  Job:  [id number]   cc:   [name], M.D.

## 2011-03-08 NOTE — Discharge Summary (Signed)
NAMEHERSHELL, BRANDL NO.:  [id number]   MEDICAL RECORD NO.:  [id number]          PATIENT TYPE:  INP   LOCATION:  5120                         FACILITY:  [facility]   PHYSICIAN:  Nestor Ramp, MD        DATE OF BIRTH:  12-16-1950   DATE OF ADMISSION:  04/14/2008  DATE OF DISCHARGE:  04/17/2008                               DISCHARGE SUMMARY   REASON FOR HOSPITALIZATION:  Abdominal pain.   PRIMARY CARE Angeliz Settlemyre:  [name], M.D. at [facility].   DISCHARGE DIAGNOSES:  1. Mild pancreatitis.  2. History of nonobstructive coronary artery disease with a normal      stress test in November 2008.  3. Osteoarthritis.  4. Hyperlipidemia.  5. Hypothyroidism.   DISCHARGE MEDICATIONS:  1. Percocet 5/325 mg, 1-2 tablets every 4-6 hours as needed for pain.  2. Synthroid 150 mcg p.o. daily.  3. Cholesterol medicine as previously prescribed by Dr. Perrin Maltese.  4. The patient is to avoid taking Tylenol while taking Percocet.   DISCHARGE INSTRUCTIONS:  1. The patient is to follow up with [facility] on Monday the      next week.  2. The patient is to call [facility] for followup      appointment and to inquire about his account with them.  3. The patient is to ask Dr. Perrin Maltese for referral to another      gastroenterologist if necessary.  4. The patient is to drink plenty of fluids and increase solid intake      slowly.   CONSULTATIONS:  None.   PROCEDURES:  None.   SIGNIFICANT FINDINGS:  1. Imaging, CT scan performed on April 14, 2008, showed slight haziness      around the pancreatic head and second portion of duodenum      consistent with possible early focal pancreatitis.  This CT scan is      consistent with previous CT scan performed in November 2007.  2. Right upper quadrant ultrasound performed on April 15, 2008, shows a      normal gallbladder, a right renal cyst, and echogenic liver      parenchyma without a focal lesion.   LABORATORY FINDINGS:  Lipase on admission was elevated at 268.  On date  of discharge, lipase was normal at 49, it was also normal on the day  prior to discharge at 57.  CBC performed on admission showed a white  blood cell count of 21.7, hemoglobin of 14.0, and a platelet count of  271.  CBC on the date of discharge showed white blood cell count of  15.1, hemoglobin 13.5, and platelet count of 248.  Lipid profile  performed during this hospitalization showed a total cholesterol of 178,  triglycerides 109, HDL 44, and LDL 112.  LFTs performed on the date of  discharge were completely within normal limits except for slightly low  albumin at 2.8.   BRIEF HOSPITAL COURSE:  The patient is a 60 year old male with history  of chronic epigastric abdominal pain who came in for abdominal pain  to  the emergency department.  CT scan showed haziness around the pancreatic  head and findings consistent with early pancreatitis.  Lipase was also  elevated at about 200.  Therefore, the patient was admitted for  management of mild pancreatitis.  CT scan performed during this  admission was similar to a previous CT scan performed in 2007.  At that  time, lipase was found to be normal.  At this time, lipase was found to  be elevated.  The patient was made n.p.o. and given aggressive IV fluid  rehydration.  The patient had worsening pain after admission and  required IV Dilaudid to manage his pain.  On April 16, 2008, the  patient's pain was improved.  He was transitioned to oral Percocet  without any difficulty.  The patient was allowed to take liquids by  mouth and tolerated these without difficulty.  IV fluids were decreased  and the patient continued to advance his diet without any further  problems.  On April 16, 2008, lipase came down to the normal range and  remained normal.  On the date of discharge, the patient was tolerating  liquids without difficulty.  On the date of discharge, his abdominal  pain  was controlled on oral pain medicines.  Right upper quadrant  ultrasound performed during this hospitalization was grossly normal.  Given that the patient's symptoms had improved significantly and lipase  had come down to normal range, he was deemed fit for discharge on April 17, 2008.  We advised that the patient should follow up gastroenterology  as an outpatient. We called their office to arrange his appointment but  they stated that his account had been blocked due to credit issues.  We  would recommend that Dr. Perrin Maltese follow this up and make another referral  to gastroenterology as indicated.  The patient was discharged to home in  stable condition on April 17, 2008.   DISCHARGE CONDITION:  Stable.   DISPOSITION:  Discharged to home.   ISSUES FOR FOLLOWUP:  The patient's pain control should be assessed at  outpatient follow up with Dr. Perrin Maltese at [facility].  Referral  should also be considered for outpatient gastroenterology consult, as  the abdominal pain has been a chronic issue for this patient, and the  care of a gastroenterologist could be indicated.      Myrtie Soman, MD  Electronically Signed      Nestor Ramp, MD  Electronically Signed    TE/MEDQ  D:  04/17/2008  T:  04/18/2008  Job:  [id number]   cc:   [name], M.D.

## 2011-03-11 NOTE — Procedures (Signed)
[facility]  Patient:    [name], [name]                        MRN: [id number] Proc. Date: 06/21/00 Adm. Date:  [id number] Attending:  Carolan Shiver Ii CC:         Anesthesia Department                           Procedure Report  I was consulted by Dr. Carlisle Beers. Rendall III to provide postoperative pain relief for his patient, [name].  We decided at this stage to perform an epidural catheter placement and postoperative monitoring by the anesthesiologist.  The risks and benefits of the procedure were discussed in the preoperative period with the patient.  He was aware of the risks and benefits and agreeable to the procedure.  At the completion of the operative procedure, the patient was turned into the right lateral decubitus position.  The back was prepped x 3 from a sterile epidural tray.  A 17-gauge Tuohy needle was used in a paramedian approach at the L2-3 interspace.  A loss of resistance technique with air was used.  The epidural space was located easily on the first attempt.  A Burron catheter was passed 7 cm into the epidural space.  The needle was removed without apparent movement of the catheter.  The catheter was aspirated, there was no evidence of cerebral spinal fluid or blood.  The catheter was injected with solution containing 5 cc of 1% lidocaine plain and 10 cc of sterile preservative-free normal saline. Catheter injected easily, repeat aspiration revealed no evidence of cerebral spinal fluid or blood.  Catheter was taped securely in place.  Patient was placed in the supine position and he emerged uneventfully from the anesthetic.  The patient was brought to the recovery room in good condition. DD:  06/21/00 TD:  06/21/00 Job: [id number]

## 2011-03-11 NOTE — H&P (Signed)
NAMEGORDEN, STTHOMAS                 ACCOUNT NO.:  [id number]   MEDICAL RECORD NO.:  [id number]          PATIENT TYPE:  INP   LOCATION:  0101                         FACILITY[facility]  [facility]   PHYSICIAN:  Corinna L. Lendell Caprice, MDDATE OF BIRTH:  Nov 30, 1950   DATE OF ADMISSION:  09/14/2006  DATE OF DISCHARGE:                              HISTORY & PHYSICAL   CHIEF COMPLAINT:  Abdominal pain.   HISTORY OF PRESENT ILLNESS:  Mr. Kaeser is a 60 year old unassigned white  male who presents to the emergency room with right-sided abdominal pain.  He was seen earlier today at urgent care and sent to the emergency room.  He has had no vomiting or diarrhea.  The pain has been waxing and waning  for the past 3 days.  He has had subjective fevers.  His appetite has  been good.  He has no sick contacts.  His bowel movements have been  normal.  He feels better after Dilaudid.   PAST MEDICAL HISTORY:  Hypothyroidism, non-critical LAD disease,  osteoarthritis.   MEDICATIONS:  1. Skelaxin as needed.  2. Synthroid 150 mcg daily.  3. Aspirin 81 mg a day.   ALLERGIES:  HE IS ALLERGIC TO CRESTOR AND LIPITOR WHICH CAUSES STOMACH  CRAMPS.   SOCIAL HISTORY:  He works in Production designer, theatre/television/film.  He does not smoke, drink, or  use drugs.  He is here with his wife.   FAMILY HISTORY:  His mother and father both had heart disease in their  68s.  Multiple immediate family members with diabetes.  No cancer.  No  previous abdominal surgery.   REVIEW OF SYSTEMS:  As above, otherwise negative.   PHYSICAL EXAMINATION:  Temperature 97 degrees, blood pressure 156/90,  pulse 86, respiratory rate 20, oxygen saturation 97% on room air.  GENERAL:  Patient is an overweight white male in no acute distress.  HEENT:  Normocephalic, atraumatic.  Pupils equal, round, and reactive to  light.  Sclerae are nonicteric.  Moist mucous membranes.  NECK:  Supple.  No lymphadenopathy.  LUNGS:  Clear to auscultation bilaterally without wheezes,  rhonchi or  rales.  CARDIOVASCULAR:  Regular rate, rhythm without murmurs, gallops, or rubs.  ABDOMEN:  Normal bowel sounds, soft.  Right-sided abdominal tenderness.  No rebound tenderness.  GU/RECTAL:  Deferred.  EXTREMITIES:  No clubbing, cyanosis, or edema.  SKIN:  No rash.  PSYCHIATRIC:  Normal affect.   LABS:  Amylase/lipase normal.  White blood cell count 13,000, otherwise  unremarkable CBC.  PT/PTT normal.  Complete metabolic panel essentially  unremarkable.  CT of the abdomen and pelvis shows diffuse fatty  infiltration of the liver.  Small calcification of right hepatic lobe  adjacent to gallbladder fossa.  Likely a small calcified granuloma.  Normal appendix.  Abnormal mesenteric stranding along the right margin  of the pancreatic head.  Could be duodenal inflammation, ulcer or  pancreatitis.  A small right mid-kidney cyst.   ASSESSMENT AND PLAN:  1. Right-sided abdominal pain with leukocytosis and mesenteric      stranding on CAT scan.  I doubt this is pancreatitis with normal  pancreatic enzymes.  It could be a duodenal process.  I will admit      him to the floor and give pain medication, empiric Unasyn, and      consult gastrointestinal in the morning.  He has seen Dr. Virginia Rochester in      the past.  There is an EGD from 2001 in the computer which shows      esophagitis and probable inflammatory nodule of the duodenal bulb.      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  Patient will be on clear liquids for now.  I      will give intravenous Protonix, hold his aspirin.  2. Hypothyroidism.  Continue Synthroid.      Corinna L. Lendell Caprice, MD  Electronically Signed     CLS/MEDQ  D:  09/14/2006  T:  09/14/2006  Job:  [id number]
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
## 2011-03-11 NOTE — Op Note (Signed)
[name], MARVIN NO.:  [id number]   MEDICAL RECORD NO.:  [id number]          PATIENT TYPE:  INP   LOCATION:  5703                         FACILITY:  [facility]   PHYSICIAN:  Georgiana Spinner, M.D.    DATE OF BIRTH:  11/26/1950   DATE OF PROCEDURE:  DATE OF DISCHARGE:                                 OPERATIVE REPORT   PROCEDURE:  Upper endoscopy with biopsy.   INDICATIONS:  Abdominal pain.   ANESTHESIA:  Fentanyl 50 mcg, Versed 5 mg.   DESCRIPTION OF PROCEDURE:  With the patient mildly sedated in the left  lateral decubitus position, the Olympus videoscopic endoscope was inserted  in the mouth, passed under direct vision through the esophagus which  appeared normal into the stomach fundus, body, antrum, duodenal bulb were  visualized.  The duodenal bulb proximally and posteriorly appeared somewhat  thickened.  This was photographed and subsequently biopsied.  Second portion  of duodenum appeared normal.  From this point the endoscope was slowly  withdrawn taking circumferential views of duodenal mucosa until the  endoscope had been pulled back into the stomach, placed in retroflexion to  view the stomach from below.  The endoscope was then straightened and  withdrawn taking circumferential views of remaining gastric and esophageal  mucosa.  Subsequently the Olympus videoscopic side-viewing duodenoscope was  then inserted in the mouth.  It too was then passed through the esophagus  into the stomach and subsequently to the duodenum where the thickening of  the duodenum was once again seen and photographed and biopsied on direct  view.  We were able to advance past this to the second portion of the  duodenum.  The ampulla was seen, appeared normal, was not photographed but  was viewed.  The endoscope was then withdrawn.  The patient's vital signs  and pulse oximeter remained stable.  The patient tolerated the procedure  well without apparent complications.   FINDINGS:  Focal thickening of the duodenal bulb, but otherwise an  unremarkable examination.  Biopsies were taken.  Await biopsy report.  The  patient reports clinical improvement since being in the hospital.  Continue  present therapy with PPIs and advance diet as tolerated.  Observe clinical  response.           ______________________________  Georgiana Spinner, M.D.     GMO/MEDQ  D:  09/15/2006  T:  09/15/2006  Job:  [id number]   cc:   [name], M.D.

## 2011-03-11 NOTE — Cardiovascular Report (Signed)
NAMEMICHAELJOHN, BISS                           ACCOUNT NO.:  [id number]   MEDICAL RECORD NO.:  [id number]                   PATIENT TYPE:  INP   LOCATION:  4735                                 FACILITY:  [facility]   PHYSICIAN:  Veneda Melter, M.D.                   DATE OF BIRTH:  04-01-1951   DATE OF PROCEDURE:  04/17/2003  DATE OF DISCHARGE:                              CARDIAC CATHETERIZATION   PROCEDURES PERFORMED:  1. Left heart catheterization.  2. Left ventriculogram.  3. Selective coronary angiography.  4. Intravascular ultrasound of the left anterior descending.  5. Perclose, right femoral artery.   DIAGNOSES:  1. Moderate single-vessel coronary artery disease.  2. Normal left ventricular systolic function.   CARDIOLOGIST:  Veneda Melter, M.D.   HISTORY:  [name] is a 60 year old gentleman who presents with severe  substernal chest discomfort.  The patient had nonspecific ECG changes and  was admitted to the hospital.  He subsequently ruled out for an acute  myocardial infarction.  Anticoagulant use had resolution of discomfort.  He  presents now for further cardiac assessment.   TECHNIQUE:  Informed consent was obtained.  The patient was brought to the  catheterization lab.  A 6 French sheath was inserted into the right femoral  artery using the modified Seldinger technique.  A 6 [place] JL-4 and JR-4  catheters were then used to engage the left and right coronary arteries, and  selective angiography performed in various projections using manual  injections of contrast.  A 6 French pigtail catheter was then advanced in  the left ventricle and a left ventriculogram using power injections of  contrast.  Intravascular ultrasound of the LAD was then performed using a 6  [place] Q2.5 guide catheter and a 0.014 inch Export wire.  The patient was  given 3,000 units of heparin intravenously.  This was performed using  automated pullback and subsequent showed no evidence of vessel  damage.  The  guide catheter was then removed and a Perclose suture closure device to the  right femoral artery until adequate hemostasis was achieved.   The patient tolerated the procedure well and was transferred to the floor in  stable condition.   FINDINGS:  The findings are as follows.   1. Left Main Trunk:  The left main trunk is a medium caliber vessel with     moderate irregularities.   1. LAD:  The LAD begins as a large caliber vessel that provides a large     first diagonal branch and first septal perforator in the proximal     segment, it then extends to the apex.  There is a small second diagonal     branch that arises distally.  The LAD has mild irregularities proximally.     There is then a focal hazy narrowing of 60% after the first diagonal     branch.  Further disease of 40-50% is noted in the midsection prior to     the second diagonal branch.  The apical LAD has mild irregularities and     this is a small caliber vessel.  The large first diagonal branch has mild     disease of 20-30.   Intravascular ultrasound of the proximal and mid LAD shows the LAD to be a  large caliber vessel of 3 mm in the midsection and at least 4 mm in the  proximal segment.  There is moderate calcification and plaque in the  midsection of he vessel with a residual lumen of at least 2.1-2.3 mm.  This  lesion was thus considered noncritical.   1. Left Circumflex Artery:  The left circumflex artery is a medium caliber     vessel that provides three marginal branches.  He left circumflex system     has mild irregularities.   1. Right Coronary Artery:  The right coronary artery is dominant and a large     caliber vessel that provides the posterior descending artery and     posterior ventricular branch in the terminal segment.  There are mild     irregularities of 15-20% in the mid right coronary artery.  The distal     posterior descending artery tapers as it approaches the apex with      narrowings of 30-40%.  The posterior ventricular branch has moderate     irregularities.   1. LV:  The LV is mildly dilated in systolic and diastolic dimensions.  Over     left ventricular function is low-normal.  Ejection fraction at     approximately 50%.  There is no mitral regurgitation.  Mild hypokinesis     of the mid inferior wall is noted.  LV pressure is 135/10, aortic is     135/80 and LVEDP equals 15.   ASSESSMENT/PLAN:  [name] is a 60 year old gentleman who presents with a  moderate single-vessel coronary artery disease involving the left anterior  descending.  This does not appear to be a critical lesion.  He has mild  hypokinesis of the mid inferior wall.  It was unclear whether this  represents a stenting due to placement due to placement of the pigtail in  the left ventricle or could represent a focal myositis.  This may be  consistent with the patient's echocardiographic changes and symptoms.   Overall, the patient appears to have well-preserved left ventricular  function and medical therapy will be recommended.  He will be placed on an  nonsteroidal anti-inflammatory for five to seven days  he will also be  placed on proton pump inhibitor and reassessment of symptoms performed in  several weeks time.  Consideration may be given towards EGD should he have  persistence of discomfort.  Aggressive therapy of the patient's coronary  disease with high dose statin may be beneficial such as high dose Lipitor at  80 mg per day to halt the progression of disease.                                                 Veneda Melter, M.D.    [name]  D:  04/17/2003  T:  04/19/2003  Job:  [id number]  [name], M.D.  [facility]  [address]  [place]  [place]  [address]  Fax: [phone]   cc:   [name], M.D.  [facility]  [address]  [place]  [place] [address] Fax: [phone]

## 2011-03-11 NOTE — Consult Note (Signed)
NAMEALGIE, WESTRY NO.:  [id number]   MEDICAL RECORD NO.:  [id number]          PATIENT TYPE:  INP   LOCATION:  5703                         FACILITY:  [facility]   PHYSICIAN:  Rachael Fee, MD   DATE OF BIRTH:  04-14-1951   DATE OF CONSULTATION:  DATE OF DISCHARGE:                                   CONSULTATION   GASTROINTESTINAL CONSULTATION:   REFERRED BY:  Dr. Lendell Caprice from [facility] In[facility].   PRIMARY GASTROENTEROLOGIST:  Dr. Sabino Gasser.   REASON FOR REFERRAL:  Dr. Lendell Caprice asked me to evaluate Mr. Betke in  consultation regarding abdominal pain, abnormal CT scan.   HISTORY OF PRESENT ILLNESS:  Mr. Losito is very pleasant 60 year old man who  has had approximately 3 days of right-sided abdominal pain.  He describes  his pain as a twisting, turning pain.  Pain is not made worse by eating, is  not made by any positional changes.  He has been taking Advil, several a  day, since the pain began, but only rarely did he take NSAIDs prior to this  pain.  He has had no nausea, vomiting.  No constipation, diarrhea.  No  fevers or chills.  He has had no sick contacts.  The pain gradually worsened  until he presented to the emergency room.  Lab tests and imaging studies  were performed.  Lab tests showed a CBC with a elevated white count of 13.7  thousand, otherwise the CBC was normal.  Liver tests were normal.  Amylase  and lipase were normal.  Coags were normal.  The CT scan done showed  stranding in the region of the head of his pancreas and periduodenal  stranding.  No obvious tumors were seen.   REVIEW OF SYSTEMS:  Notable for stable weight for the past several months.  It is otherwise essentially normal and a full review of systems is available  on his admitting H&P.   PAST MEDICAL HISTORY:  1. Osteoarthritis.  2. Hypothyroidism.  3. Critical LAD disease.   OUTPATIENT MEDICATIONS:  Skelaxin, Synthroid, aspirin.   CURRENT INPATIENT MEDICATIONS:   As above plus Protonix 40 mg IV b.i.d.  started today.   ALLERGIES:  He is allergic to CRESTOR and LIPITOR.   SOCIAL HISTORY:  Works in Production designer, theatre/television/film, nonsmoker, nondrinker, married.   FAMILY HISTORY:  No colon disease, Crohn's disease, no cancers that he knows  of.   PHYSICAL EXAMINATION:  VITAL SIGNS:  Temperature 97.8, pulse 64,  respirations 20, blood pressure 123/68, sat 96% on room air.  GENERAL:  Well appearing.  NEUROLOGIC:  Alert oriented x3.  HEENT:  Eyes:  Extraocular movements intact.  Mouth:  Oropharynx moist, no  lesions.  NECK:  Supple, no lymphadenopathy.  CARDIOVASCULAR:  Heart:  Regular rate and rhythm.  LUNGS:  Clear to auscultation bilaterally.  ABDOMEN:  Soft, minimally to moderately tender in the upper epigastrium,  nondistended, normal bowel sounds.  No obvious ascites, no peritoneal signs.  EXTREMITIES:  No lower extremity edema.  SKIN:  No rashes or lesions are visible on extremities.   ASSESSMENT/PLAN:  A 60 year old man with abdominal pain, periduodenal  peripancreatic inflammation seen on computed tomography scan.   He did not take nonsteroidal antiinflammatory drugs until this began, so I  doubt this is nonsteroidal antiinflammatory drug-related, but it is very  suspicious for peptic ulceration.  The area in his duodenum does look very  reachable by upper endoscopy, so I will arrange for that to done tomorrow.  Dr. Virginia Rochester has done endoscopy on him before in 2001 and that showed some  esophagitis.  It was otherwise essentially normal.   RECOMMENDATIONS:  Continue clear liquids tonight, n.p.o. after midnight,  continue IV PPI.  Repeat a CBC, a CMET, amylase and lipase tomorrow morning.      Rachael Fee, MD  Electronically Signed     DPJ/MEDQ  D:  09/14/2006  T:  09/14/2006  Job:  [id number]   cc:   Georgiana Spinner, M.D.

## 2011-03-11 NOTE — Op Note (Signed)
NAMEPANCHO, [name]                           ACCOUNT NO.:  [id number]   MEDICAL RECORD NO.:  [id number]                   PATIENT TYPE:  OIB   LOCATION:  2550                                 FACILITY:  [facility]   PHYSICIAN:  Almedia Balls. Ranell Patrick, M.D.              DATE OF BIRTH:  [birth year]   DATE OF PROCEDURE:  06/17/2004  DATE OF DISCHARGE:  06/17/2004                                 OPERATIVE REPORT   PREOPERATIVE DIAGNOSIS:  Left shoulder Grade 5 acromioclavicular separation.   POSTOPERATIVE DIAGNOSIS:  Left shoulder Grade 5 acromioclavicular  separation.   OPERATION/PROCEDURE:  Left shoulder acromioclavicular reduction and  placement of cortical clavicular screw (DePuy-Rockwood screw).   ATTENDING SURGEON:  Almedia Balls. Ranell Patrick, M.D.   FIRST ASSISTANT:  Maisie Fus B. Dixon, P. A.   ANESTHESIA:  General anesthesia was used.   ESTIMATED BLOOD LOSS:  Minimal.   FLUID REPLACEMENT:  Five-hundred milliliters crystalloid.   COUNTS:  Instrument count was correct.   COMPLICATIONS:  No complications.   MEDICATIONS:  Preventative antibiotics were given.   INDICATIONS FOR SURGERY:  The patient is a 60 year old male who sustained a  severe left shoulder AC separation.  He complains of severe pain in the  shoulder area.  The patient is unable to move this shoulder secondary to  disabling pain in the shoulder.  He has a large Grade 5 AC separation  present on x-ray.   After counseling the patient regarding options for management to include  conservative management versus surgical treatment the patient elected to  proceed with surgery.  Informed consent was obtained.   DESCRIPTION OF THE OPERATION:  After an adequate level of anesthesia was  achieved the patient was positioned in the modified beach chair position.  The left shoulder was imaged using a C-arm intensifier.  We did a  percutaneous stab incision using C-arm for guidance over the distal clavicle  drilling through with a 45 drill  bit.  We then placed a 32 drill bit through  that and drilled a bicortical drill hole in the coracoid process at the  base.  We then placed a 50 mm in length cortical clavicular screw by DePuy  with a washer.  This  engaged the cortical bone in the coracoid process and  gained anatomic reduction of the [facility] joint, and a restoration of the cortical  clavicular interval.   The screw gave excellent purchase.  We took this shoulder through a range of  motion.  No displacement of the [facility] joint was noted.   At this point we irrigated the wound  and closed it using interrupted nylon  suture followed by sterile dressing and a shoulder sling.   The patient was taken to the recovery room in stable condition.  Almedia Balls. Ranell Patrick, M.D.    SRN/MEDQ  D:  06/17/2004  T:  06/18/2004  Job:  [id number]

## 2011-03-11 NOTE — H&P (Signed)
[place]  Patient:    [name], [name]                      MRN: [id number] Adm. Date:  06/21/00 Attending:  Jonny Ruiz L. Dorothyann Gibbs, M.D. Dictator:   Arnoldo Morale, P.A.-C.                         History and Physical  DATE OF BIRTH:  1950/12/09  CHIEF COMPLAINT:  Progressively worsening right knee pain for the last 5-10 years.  HISTORY OF PRESENT ILLNESS:  This 60 year old white male patient of Dr. Jonny Ruiz L. Rendalls presented to him with complaints of continued right knee pain and difficulty for the last 5-10 years.  He has a history of a right knee arthroscopy in February 1997, and has had pain on and off since that time.  He says the pain at this time has been fairly constant, and is described as a throbbing-type of pain, which is located diffusely about the center of the knee joint without radiation.  The pain increases with bending of the knee, and decreases with lying down.  The knee does grind and catch and give way at times, and does swell.  He has had an episode where the knee popped, and he fell, and that was the motivating factor to having his knee fixed.  He is not currently taking any medications for pain, an d he has no locking paresthesias, hip, back, or night pain.  He has had no known injury to his knee.  He does not walk with any assistive devices.  ALLERGIES:  No known drug allergies.  CURRENT MEDICATIONS: 1. Synthroid 150 mcg one tablet p.o. q.d. 2. Cipro 500 mg one tablet p.o. b.i.d. 3. Enteric-coated aspirin 325 mg one tablet p.o. q.d., with his last dose    being on May 15, 2000.  PAST MEDICAL HISTORY: 1. He has had hypothyroidism for the last two years. 2. He has been treated for the last month for a urinary tract and prostate    infection.  He is followed for both of these problems by Dr. Robert Bellow.  He denies any history of diabetes mellitus, hypertension, hiatal hernia, peptic ulcer disease, heart disease,  asthma, or any other chronic medical condition, other than noted previously.  PAST SURGICAL HISTORY: 1. He had extensive oral surgery to remove all of his teeth because of    an infection in 1971. 2. Right knee arthroscopy by Dr. Priscille Kluver in February 1997.  SOCIAL HISTORY:  He denies any history of cigarette smoking, alcohol use, or drug use.  He is married and has five children.  He and his wife live in a one-story house with four steps into the main entrance.  He currently works in Production designer, theatre/television/film at [employer].  His medical doctor is Dr. Robert Bellow, phone #[phone].  FAMILY HISTORY:  His mother died at age 19, with heart disease, diabetes mellitus, and hypertension.  His father is alive at age 35, with heart disease, diabetes mellitus, and hypertension.  He has two brothers age 5 and 2, with the 10 year old having diabetes mellitus.  He has one sister who is alive at age 53, and she is healthy.  He has five children who range in age from 25-27.  They are all alive and healthy.  REVIEW OF SYSTEMS:  He has occasional migraines.  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  He did have a guaiac-positive stool, and that is being evaluated at this time.  He is seeing, I believe, Dr. Perrin Maltese on Monday, June 19, 2000, for further evaluation.  He has had a lower GI workup, and it sounds like he is getting ready to have an upper GI.  He does have osteoarthritis in the left knee.  He has a full plate of dentures on both the upper and lower jaw line.  All other systems are negative and noncontributory at this time.  PHYSICAL EXAMINATION:  GENERAL:  This is a well-developed, well-nourished, very large and tall white male, in no acute distress.  He walks easily and with a noticeable limp on the right side when he first gets up and starts moving.  This seems to fade with prolonged walking.  He talks easily and appropriately with the examiner.  The mood and affect are appropriate.  Alert  and oriented x 3.  Height 5 feet 11 inches, weight 270 pounds.  BMI is 37.  VITAL SIGNS:  Temperature 98.9 degrees Fahrenheit, pulse 92, respirations 26, blood pressure 126/84.  HEENT:  Normocephalic, atraumatic, without frontal or maxillary sinus tenderness to palpation.  Conjunctivae pink.  Sclerae anicteric.  PERRLA. EOMs intact.  Funduscopic examination shows visible red reflex bilaterally with normal retinal vasculature, optic disc, and cup.  No visible external ear deformities.  Hearing is grossly intact.  Tympanic membranes pearly gray bilaterally with good light reflex.  Nose and nasal septum midline.  Nasal mucosa pink and moist without exudates or polyps noted.  Buccal mucosa pink and moist.  Good dentition.  Pharynx without erythema or exudate.  Tongue and uvula midline.  Tongue without fasciculations.  Uvula rises equally with phonation.  NECK:  No visible masses or lesions noted.  Trachea midline.  No palpable lymphadenopathy or thyromegaly.  Carotids +2 bilaterally without bruits.  Full range of motion of the cervical spine without pain.  Nontender to palpation along the cervical spine.  CARDIOVASCULAR:  Heart rate and rhythm regular.  S1, S2 present, without rubs, clicks, or murmurs noted.  LUNGS:  Respirations even and unlabored.  Breath sounds clear to auscultation bilaterally, without rales or wheezes noted.  ABDOMEN:  Rounded abdominal contour.  Bowel sounds present x 4 quadrants. He is soft with minimal tenderness to palpation in the right upper quadrant. This seems to be fairly diffuse.  There is no rebound or guarding noted. There is no hepatosplenomegaly or CVA tenderness.  Femoral pulses are +2 bilaterally.  BREASTS/GU/RECTAL:  Examinations are deferred at this time.  MUSCULOSKELETAL:  No obvious deformities of his bilateral upper extremities, with a full range of motion in these extremities, without pain.  His radial pulses are +2 bilaterally.  He has a  full range of motion of his hips, ankles, and toes bilaterally with DP and PT pulses +2.  He is able to flex the left  knee to about 100 degrees with full extension.  There is no effusion in the knee.  Minimal crepitus with range of motion.  No pain with palpation along the joint line of the left knee.  Collateral ligaments are stable.  His right knee is able to get to full extension, but is only able to flex to about 85 degrees.  He has a large amount of crepitus with range of motion of the knee, and it is painful.  He has pain with palpation of both the medial and the lateral joint line.  Minimal effusion in the knee.  The collateral ligaments do appear to be stable.  NEUROLOGIC:  Alert and oriented x 3.  Cranial nerves II-XII grossly intact. Strength 5/5 bilateral upper and lower extremities.  Rapid alternating movements intact.  Sensation intact.  Deep tendon reflexes 2+ bilateral upper and lower extremities.  RADIOLOGIC FINDINGS:  X-rays taken of his knee in May 2001, on nonweightbearing films at that time, showing end-stage osteoarthritis of the left knee.  New standing films taken at that time show bone on bone contact in the medial compartment of the right knee and a 50% narrowing of the medial compartment of the left knee.  IMPRESSION: 1. End-stage osteoarthritis of bilateral knees, with the right worse    than the left. 2. Hypothyroidism. 3. Urinary tract infection with prostate infection. 4. History of guaiac-positive stools, being evaluated.  PLAN:  Mr. Stogdill will be admitted to [facility] on June 21, 2000, where he will undergo a right total knee arthroplasty by Dr. Priscille Kluver.  He will undergo all the routine preoperative laboratory tests and studies prior to this procedure.  If we have any medical problems with the patient, while he is in the hospital, we will contact Dr. Leodis Liverpool office for a consultation.DD:  06/16/00 TD:  06/16/00 Job:  [id number]

## 2011-03-11 NOTE — Discharge Summary (Signed)
[name], [name]                           ACCOUNT NO.:  [id number]   MEDICAL RECORD NO.:  [id number]                   PATIENT TYPE:  INP   LOCATION:  4735                                 FACILITY:  [facility]   PHYSICIAN:  Veneda Melter, M.D.                   DATE OF BIRTH:  [birth year]   DATE OF ADMISSION:  04/16/2003  DATE OF DISCHARGE:  04/17/2003                                 DISCHARGE SUMMARY   DISCHARGE DIAGNOSIS:  Chest pain.   HISTORY OF PRESENT ILLNESS:  Mr. Semel is a 60 year old white male with a  history of hypothyroidism, who presents for assessment of substernal chest  discomfort.  The patient awoke in the early hours of April 16, 2003, with  substernal chest pressure with mild shortness of breath.  He presented to  Dr. Ernestene Mention office, where he had continual pain and nonspecific T wave  changes in the inferior leads.  He is referred to [facility] for  further assessment, and due to ongoing symptoms, was admitted to [facility].  He was subsequently anticoagulated, stabilized medically with  resolution of pain.  Serial cardiac enzymes were obtained, showing no  evidence of myocardial injury.  He was referred for cardiac catheterization  on 04/17/2003, showing moderate single vessel coronary artery disease of 60%  involving mid LAD.  Intravascular ultrasound showed this to be a noncritical  lesion.  He had well preserved LV function with very mild hypokinesis of the  mid inferior wall.  Unclear whether this is due to transient spasm from  placement of pigtail catheter versus a focal myositis.  The patient had  resolution of symptoms, and was deemed stable for discharge.  He will be  placed on a proton pump inhibitor as well as high-dose statin therapy for  stabilization of coronary plaque.  Reassessment of the patient's symptoms  will be made in two to three weeks and consideration given towards  esophagogastroduodenoscopy if he continues to have chest  discomfort.  He  will also have a fasting lipid profile and LFT's obtained in approximately  six weeks' time for followup after starting Lipitor 40 mg daily.  This may  be advanced to 80 mg for its beneficial effect on plaque stabilization.   CONDITION ON DISCHARGE:  Stable.   DISCHARGE MEDICATIONS:  1. Ibuprofen 600 mg t.i.d. x5 days.  2. Prilosec 20 mg daily x6 weeks.  3. Lipitor 40 mg daily.  4. He is to continue his Synthroid 150 mcg daily.  5. Tequin which had been recently started for prostatitis.   ACTIVITY:  No heavy lifting, driving, or sexual activity for two days.  He  is to return to work on Monday, April 21, 2003.   FOLLOWUP:  1. Dr. Perrin Maltese in two to three weeks and obtain blood work in six weeks,     including fasting lipid profile as well as  liver function tests.  2. He will follow up with cardiology as needed.                                              Veneda Melter, M.D.   [name]  D:  04/17/2003  T:  04/19/2003  Job:  [id number]

## 2011-03-11 NOTE — Procedures (Signed)
[facility]  Patient:    [name], [name]                        MRN: [id number] Proc. Date: 06/20/00 Adm. Date:  [id number] Attending:  Sabino Gasser                           Procedure Report  PROCEDURE:  Upper endoscopy with biopsy.  SURGEON:  Sabino Gasser, M.D.  INDICATIONS:  Hemoccult positivity, rule out ulcer disease prior to anticipated orthopedic surgery with knee replacement tomorrow.  ANESTHESIA:  Demerol 70 mg and Versed 7 mg was given intravenously in divided dose.  DESCRIPTION OF PROCEDURE:  With the patient mildly sedated in the left lateral decubitus position, the Olympus videoscopic endoscope was inserted in the mouth and passed under direct vision through the esophagus.  The distal esophagus showed changes of possible esophagitis, photographed and biopsied. We entered into the stomach.  The fundus, body, and antrum all appeared normal.  In the duodenal bulb, there was a 0.5 cm nodule posteriorly, ______ and may have been inflammatory tissue, but biopsies were taken of this tissue. We then advanced to the second portion of the duodenum which appeared normal. From this point, the endoscope was slowly withdrawn, taking circumferential views of the entire duodenal mucosa which appeared normal other than the posterior nodule as described.  The endoscope was then placed on retroflexion to view the stomach from below, and this too appeared normal.  The endoscope was then straightened and withdrawn, taking circumferential views of the remaining gastric and esophageal mucosa which otherwise appeared normal.  The patients vital signs and pulse oximeter remained stable.  The patient tolerated the procedure well without apparent complications.  FINDINGS:  Changes of esophagitis, biopsied, and what appears to be probably an inflammatory nodule posteriorly in the duodenal bulb, biopsied.  Await biopsy report.  The patient will call me for results and  follow up with me as an outpatient. DD:  06/20/00 TD:  06/20/00 Job: [id number]

## 2011-03-11 NOTE — Discharge Summary (Signed)
[name], [name]                 ACCOUNT NO.:  [id number]   MEDICAL RECORD NO.:  [id number]          PATIENT TYPE:  INP   LOCATION:  5037                         FACILITY:  [facility]   PHYSICIAN:  Madlyn Frankel. Charlann Boxer, M.D.  DATE OF BIRTH:  1951-02-22   DATE OF ADMISSION:  07/25/2008  DATE OF DISCHARGE:  07/28/2008                               DISCHARGE SUMMARY   ADMITTING DIAGNOSES:  1. Osteoarthritis.  2. Hypercholesteremia.  3. Recurrent urinary tract infections.   DISCHARGE DIAGNOSES:  1. Osteoarthritis.  2. Hypercholesteremia.  3. Recurrent urinary tract infections.   HISTORY OF PRESENT ILLNESS:  A 60 year old male with a history of left  knee pain secondary to osteoarthritis, refractory to all conservative  treatments.  Presurgical assessed by Dr. Perrin Maltese.   CONSULTS:  None.   PROCEDURE:  Left total knee replacement.   SURGEON:  Madlyn Frankel. Charlann Boxer, MD   ASSISTANT:  Yetta Glassman. Mann, PA-C   LABORATORY DATA:  Final CBC:  Hemoglobin 12.5, hematocrit 36.9,  platelets 223.  White cell differential no significant abnormalities.  Coagulation all within normal limits.  Metabolic panel upon discharge:  Sodium 134, potassium 2.8, glucose 123, creatinine 0.77.  Calcium 8.1.  UA negative.   Radiology:  EKG normal sinus rhythm.  Radiology:  Chest 2-view, no  active disease, performed prior to this admission.   HOSPITAL COURSE:  The patient admitted to the hospital and underwent  left total knee replacement with admit to orthopedic floor.  Endurance  course stay and was unremarkable.  He made good progress of physical  therapy and remained hemodynamically orthopedically stable.  By day #3,  he was doing well, met all criteria necessary for discharge home.  The  dressing was changed, the wound looked great.  Home health care was  selected and __________ care was provided.   DISCHARGE DISPOSITION:  Discharged home in stable and improved  condition.   DISCHARGE MEDICATIONS:  1. Lovenox  40 mg subcu q.24 h. x11 days.  2. Robaxin 500 mg p.o. q.6 h.  3. Enteric-coated aspirin 325 mg p.o. daily after Lovenox.  4. Iron 325 mg p.o. t.i.d.  5. Colace 100 mg p.o. b.i.d.  6. MiraLax 8 g p.o. daily.  7. Oxycodone 5 mg one to three p.o. q. 3-4 p.r.n. pain.  8. Celebrex 200 mg one p.o. b.i.d. x2 weeks.  9. Synthroid 150 mcg p.o. daily.  10.Zetia 10 mg p.o. daily.  11.Vitamin B 3000 units daily.   DISCHARGE DIET:  Regular.   DISCHARGE WOUND CARE:  Keep dry.   DISCHARGE PHYSICAL THERAPY:  Weightbearing as tolerated with use of  rolling walker.   DISCHARGE FOLLOWUP:  Follow up with Dr. Charlann Boxer at __________ 2 weeks wound  check.     ______________________________  Yetta Glassman. [address], [place]      Madlyn Frankel. Charlann Boxer, M.D.  Electronically Signed    BLM/MEDQ  D:  10/02/2008  T:  10/02/2008  Job:  [id number]

## 2011-03-11 NOTE — Discharge Summary (Signed)
. [facility]  Patient:    [name], [name]                        MRN: [id number] Adm. Date:  [id number] Disc. Date: [id number] Attending:  Carolan Shiver Ii Dictator:   Jamelle Rushing, P.A.-C.                           Discharge Summary  ADMISSION DIAGNOSES: 1. End-stage osteoarthritis bilateral knees, right greater than left. 2. Hypothyroidism. 3. Urinary tract infection with prostate infection. 4. History of guaiac-positive stools, currently being evaluated.  DISCHARGE DIAGNOSES: 1. Status post right total knee replacement. 2. Postoperative blood loss anemia.  HISTORY OF PRESENT ILLNESS:  The patient is a 60 year old white male with a history of right knee arthroscopy in 1997.  The patient presents with a five to 10-year history of progressively worsening right knee pain.  The pain is described as a constant throbbing sensation located over the middle aspect of the knee with no radiation.  The pain increases with bending and improves with rest.  The patient does have popping, catching, grinding, swelling, and giving out of the knee.  The patient does not use any assist devices, knows of no injuries, and has no night pain.  ALLERGIES:  No known drug allergies.  CURRENT MEDICATIONS: 1. Synthroid 150 mcg p.o. q.d. 2. Cipro 500 mg p.o. b.i.d. 3. Enteric-coated aspirin 325 mg p.o. q.d.  OPERATIONS/PROCEDURES PERFORMED:  On June 21, 2000, the patient was taken to the OR by Dr. Jonny Ruiz L. Rendall assisted by Arnoldo Morale, P.A.-C. and under general anesthesia had a right total knee replacement performed.  The patient tolerated the procedure well and was transferred to the recovery room in good condition after receiving an epidural catheter placement for postoperative analgesia.  This was accomplished in surgical suite prior to transferring to the recovery room without any complications.  The patient tolerated the procedure well and was brought to  the recovery room in good condition.  HOSPITAL COURSE:  On June 21, 2000, the patient was admitted to [facility] under the care of Dr. Jonny Ruiz L. Rendall.  The patient was taken to the OR where a right total knee replacement was performed under general anesthesia.  The patient received an epidural catheter for postop analgesia with good results.  The patient then proceeded to have a six-day postoperative course during which he progressed very nicely with physical therapy.  The patient tolerated the epidural well, it covered his pain well, and he was able to proceed with physical therapy and the CPM.  On postoperative day #3 it was felt he did have a positive Homans sign.  A Doppler was obtained to rule out a DVT, and that was negative.  The patient did have some constipation, and a laxative of choice was given, and the patient did have improvement with that.  Throughout the patients hospitalization the patients wound remained benign, no sign of erythema.  He did have a moderate amount of ecchymosis about the wound.  The leg remained neuromotor vascularly intact.  The patient did state that on postoperative day #6 he had increased urinary frequency and difficulty with stopping his stream which was a common problem he has had in the past at early stages of his urinary tract infections, so he was placed back on Cipro 500 mg p.o. b.i.d., and a UA and culture and sensitivity  was sent off.  The patient was given instructions also to follow up with his primary care physician for further evaluation.  It was felt at this time the patient had progressed nicely with his physical therapy and was otherwise ready for discharge to home.  LABORATORY AND X-RAY DATA:  On June 24, 2000, the patient had a venous Doppler of right lower extremity that was negative for any sign of superficial thrombosis, or Bakers cyst on either lower extremity.  EKG on admission was normal sinus rhythm at 72  beats per minute.  CBC on September 4:  WBC is 16.3, hemoglobin 8.8, hematocrit of 25.9, platelets of 368.  September 4:  PT was 17.4 with 1.8 INR.  Routine chemistries on September 1:  Sodium of 133, glucose 142, all other values normal.  Urinalysis on June 27, 2000, was normal.  MEDICATIONS ON DISCHARGE: 1. OxyContin CR 20 mg p.o. q.12h. 2. Docusate 100 mg p.o. b.i.d. 3. Coumadin per pharmacy dosing. 4. Synthroid 150 mcg p.o. q.d. 5. Senokot one tablet p.o. t.i.d. with meals. 6. OxyIR one or two tablets q.4-6h. p.r.n. pain. 7. Cipro 500 mg one tablet twice a day. 8. The patient is to resume all of the prehospital meds.  DISCHARGE INSTRUCTIONS:  Activity as tolerated with right leg with the use of a walker.  Wound care:  The patient is to keep wound clean and dry, check daily for infection and any increased redness, pain, swelling, discharge, or temperature.  SPECIAL INSTRUCTIONS:  Nurse to draw blood pro times on September 6, 10, 17, and 24.  The patient is to contact primary care physician about urinary tract problems.  FOLLOWUP:  The patient is to call [phone] for follow-up appointment in 10 days with Dr. Priscille Kluver. DD:  07/26/00 TD:  07/27/00 Job: [id number]

## 2011-03-11 NOTE — Op Note (Signed)
[facility]  Patient:    [name], [name]                        MRN: [id number] Proc. Date: 06/21/00 Adm. Date:  [id number] Attending:  Carolan Shiver Ii                           Operative Report  PREOPERATIVE DIAGNOSIS:  Osteoarthritis, right knee.  POSTOPERATIVE DIAGNOSIS:  Osteoarthritis, right knee.  OPERATION PERFORMED:  Right LCS total knee replacement.  SURGEON:  John L. Dorothyann Gibbs, M.D.  ASSISTANT:  Arnoldo Morale, P.A.  ANESTHESIA:  General.  PATHOLOGY:  The patient has bone-on-bone medial compartment, right knee with chronic severe pain.  DESCRIPTION OF PROCEDURE:  Under general anesthesia, the right leg was prepared with DuraPrep and draped as a sterile field.  A sterile tourniquet was placed around the proximal thigh.  Leg was wrapped out with an Esmarch and a tourniquet was used at 400 mm.  The patient weighs approximately 265 pounds and has a very solid, thick leg.  Following this, a midline incision was made, patella was everted.  Multiple small vessels were cauterized.  The femur was sized to a large.  Proximal tibial resection was carried out with the tibial guide using the first femoral guide, an intercondylar drill hole was placed. Using the second guide, the anterior and posterior flare of the femoral condyles were resected but it was necessary first to release the MCL from the tibia to balance the flexion gap at 15 mm.  Following this an intramedullary guide was used and the distal femoral cut was made with a 15 mm extension gap. A recessing guide was then used.  Following this, the lamina spreader was inserted.  Remnants of the cruciate ligaments and menisci were resected.  The tibia was sized to a large plus.  Center peg hole was placed.  Trial reduction of a large plus tibia 15 mm rotating platform.  Enlarged femur reveals excellent fit and alignment but amazingly the femoral component impacted into the femur  several millimeters indicating softer bone than normally anticipated.  Following this, the patella was osteotomized.  Bony surfaces were prepared with pulse irrigation and antibiotic irrigation.  All components were cemented in the place.  Following cement hardening the excess cement was removed.  The tourniquet was let down at 55 minutes.  Multiple small vessels were cauterized.  The knee was flexed 90 degrees and then closed in layers with 0 and  #1 Ethibond and 0 and 2-0 Vicryl.  Operative time was one hour and 18 minutes.  The patient tolerated the procedure well and returned to recovery in good condition. DD:  06/21/00 TD:  06/21/00 Job: [id number]
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
## 2011-03-11 NOTE — H&P (Signed)
[name], [name]                           ACCOUNT NO.:  [id number]   MEDICAL RECORD NO.:  [id number]                   PATIENT TYPE:  INP   LOCATION:  4735                                 FACILITY:  [facility]   PHYSICIAN:  Veneda Melter, M.D.                   DATE OF BIRTH:  Feb 08, 1951   DATE OF ADMISSION:  04/16/2003  DATE OF DISCHARGE:                                HISTORY & PHYSICAL   CHIEF COMPLAINT:  Chest pain.   HISTORY OF PRESENT ILLNESS:  [name] is a 60 year old white male with  history of hypothyroidism that is referred by Dr. Perrin Maltese for assessment of  central chest discomfort.  The patient has no prior history of coronary  disease.  Has not had any rest or exertional chest pain or shortness of  breath.  Last night at approximately 10 p.m. while sitting, he noted the  onset of the severe substernal chest discomfort.  This did not radiate and  was not associated with other symptoms.  It waxed and waned through the  night.  He finally presented to Urgent Care today where he continued to have  some mild chest pressure.  ECG there showed some T-wave changes in the  inferior leads and he is referred to [place] for further assessment.  Currently,  [name] notes a continued mild substernal chest pressure, approximately 1-  2/10.  No shortness of breath, no nausea, vomiting or other associated  symptoms.  He has not had any pain as noted with exertion or rest.  He  denies any postprandial symptoms.  Has not had any history of dyspepsia or  reflux.  He has had some right knee discomfort attributed to right knee  replacement in 2001.  Review of symptoms is otherwise, noncontributory.   PAST MEDICAL HISTORY:  Notable for right total knee replacement in 2001 and  hypothyroidism x4 years.   ALLERGIES:  None.   CURRENT MEDICATIONS:  1. Synthroid 150 mcg daily.  2. Ibuprofen 800 mg t.i.d. p.r.n.  3. Tequin 1 tablet daily.  4. Hydrocodone 6-8 hours p.r.n.   SOCIAL HISTORY:   Patient is married and has two natural children, three  stepchildren.  There is no history of alcohol or tobacco use.  He currently  works in the [employer] at [employer].   PHYSICAL EXAMINATION:  GENERAL:  An overweight white male in no acute  distress.  Weight 273 pounds.  VITAL SIGNS:  Blood pressure 122/80 in the right arm; 122/77 in the left  arm.  Pulse 59.  HEENT:  Pupils are equal, round, reactive to light.  Extraocular muscles are  intact.  Oropharynx shows no lesions.  NECK:  Supple with no adenopathy, no bruits.  HEART:  Regular rate without murmurs.  LUNGS:  Clear to auscultation.  ABDOMEN:  Soft and nontender.  EXTREMITIES:  Trace pedal edema.  He has a  well healed surgical scar over  the right knee with no effusion.  Motor 5/5, sensory is intact to touch.   LABORATORY DATA:  ECG shows normal sinus rhythm at 60 beats/min.  There is T-  wave inversion in leads 3 and AVF, as well as mild flattening inversion in  leads V1-V4.  No ST segment changes are noted.   ASSESSMENT/PLAN:  [name] is a 60 year old gentleman who presents with  persistent substernal chest discomfort and pressure with nonspecific ECG  changes.  The patient has obesity and unknown status and may certainly have  coronary ischemia and this is most worrisome given his persistent pain.  Discomfort may also represent gastroesophageal reflux disease or dyspepsia  versus chest wall or lung pathology.  Will plan on admitting the patient to  the hospital.  Will anticoagulate with heparin and rule out myocardial  infarction with serial cardiac enzymes.  Will also place the patient on a  proton pump inhibitor.  Will plan to proceed with cardiac catheterization to  definitively rule out coronary disease.  The risks, benefits and  alternatives and possible complications were discussed with the patient and  his wife and they understand and agree to proceed.  Should this prove  negative, further  assessment may be beneficial with a CT scan or upper  endoscopy.                                                  Veneda Melter, M.D.    [name]  D:  04/16/2003  T:  04/16/2003  Job:  [id number]   cc:   [name], M.D.  [facility]  [address]  [place]  [place] [address]  Fax: [phone]    cc:   [name], M.D.  [facility]  [address]  [place]  [place] [address]  Fax: [phone]

## 2011-06-01 ENCOUNTER — Ambulatory Visit: Payer: Self-pay | Admitting: General Practice

## 2011-06-15 ENCOUNTER — Other Ambulatory Visit: Payer: Self-pay | Admitting: Orthopedic Surgery

## 2011-06-15 ENCOUNTER — Encounter (HOSPITAL_COMMUNITY): Payer: BC Managed Care – PPO

## 2011-06-15 LAB — DIFFERENTIAL
Lymphs Abs: 2.6 10*3/uL (ref 0.7–4.0)
Monocytes Absolute: 0.9 10*3/uL (ref 0.1–1.0)
Monocytes Relative: 10 % (ref 3–12)
Neutro Abs: 5.6 10*3/uL (ref 1.7–7.7)
Neutrophils Relative %: 60 % (ref 43–77)

## 2011-06-15 LAB — BASIC METABOLIC PANEL
BUN: 13 mg/dL (ref 6–23)
Creatinine, Ser: 0.68 mg/dL (ref 0.50–1.35)
GFR calc non Af Amer: >60 mL/min (ref >60)

## 2011-06-15 LAB — CBC
Hemoglobin: 14.5 g/dL (ref 13.0–17.0)
Platelets: 255 10*3/uL (ref 150–400)
RBC: 5.25 MIL/uL (ref 4.22–5.81)
RDW: 14.5 % (ref 11.5–15.5)

## 2011-06-15 LAB — URINALYSIS, ROUTINE W REFLEX MICROSCOPIC
Bilirubin Urine: NEGATIVE
Ketones, ur: NEGATIVE mg/dL
Protein, ur: NEGATIVE mg/dL
Specific Gravity, Urine: 1.02 (ref 1.005–1.030)

## 2011-06-15 LAB — URINE MICROSCOPIC-ADD ON

## 2011-06-15 LAB — PROTIME-INR: INR: 0.95 (ref 0.00–1.49)

## 2011-06-15 LAB — SURGICAL PCR SCREEN
MRSA, PCR: INVALID — AB
Staphylococcus aureus: INVALID — AB

## 2011-06-15 LAB — APTT: aPTT: 35 seconds (ref 24–37)

## 2011-06-16 ENCOUNTER — Encounter: Payer: Self-pay | Admitting: Internal Medicine

## 2011-06-16 ENCOUNTER — Ambulatory Visit (INDEPENDENT_AMBULATORY_CARE_PROVIDER_SITE_OTHER): Payer: BC Managed Care – PPO | Admitting: Internal Medicine

## 2011-06-16 DIAGNOSIS — I2510 Atherosclerotic heart disease of native coronary artery without angina pectoris: Secondary | ICD-10-CM

## 2011-06-16 DIAGNOSIS — Z01810 Encounter for preprocedural cardiovascular examination: Secondary | ICD-10-CM | POA: Insufficient documentation

## 2011-06-16 NOTE — Progress Notes (Signed)
HPI: [name] is a 60 y.o. male Seen at the request of Dr. Perrin Maltese and Charlann Boxer for preoperative clearance. He is anticipated to have a right total knee revision on Monday.  He works at [employer] and walks all day long without dyspnea or chest discomfort. He has known coronary artery disease that was nonobstructive by catheterization in 2004 with normal left ventricular function. He has been on chronic statin therapy and prophylactic antidiabetic therapy.  He underwent standard stress test with a Bruce protocol at 2008 by Dr. Daleen Squibb that was noted to be normal apart from mild excessive blood pressure increase  He does not have hypertension he does not smoke.  Current Outpatient Prescriptions  Medication Sig Dispense Refill  . levothyroxine (SYNTHROID, LEVOTHROID) 50 MCG tablet Take 50 mcg by mouth 3 (three) times daily.        . metFORMIN (GLUCOPHAGE) 500 MG tablet Take 500 mg by mouth daily with breakfast.        . simvastatin (ZOCOR) 40 MG tablet Take 40 mg by mouth at bedtime.          Allergies  Allergen Reactions  . Atorvastatin     REACTION: muscle cramps/spasms    Past Medical History  Diagnosis Date  . Near sighted   . Impaired hearing   . Tinnitus   . Wears dentures   . HLD (hyperlipidemia)   . Varicose veins   . Thyroid disease     not on meds  . Arthritis   . Degenerative disc disease     Past Surgical History  Procedure Date  . Shoulder surgery     both shoulders  . Knee surgery     both knees  . Mouth surgery     all teeth removed at age 46    Family History  Problem Relation Age of Onset  . Diabetes      siblings  . Heart attack Brother     triple bypass  . Heart block Brother   . Heart defect      both parents    History   Social History  . Marital Status: Married    Spouse Name: N/A    Number of Children: N/A  . Years of Education: N/A   Occupational History  . Not on file.   Social History Main Topics  . Smoking status:  Never Smoker   . Smokeless tobacco: Never Used  . Alcohol Use: No  . Drug Use: No  . Sexually Active: Not on file   Other Topics Concern  . Not on file   Social History Narrative  . No narrative on file    Fourteen point review of systems was negative except as noted in HPI and PMH   PHYSICAL EXAMINATION  Blood pressure 110/74, pulse 80, height 5\' 11"  (1.803 m), weight 248 lb (112.492 kg).   Well developed and nourished older Caucasian male appearing his stated age in no acute distress HENT normal Neck supple with JVP-flat Carotids brisk and full without bruits Back without scoliosis or kyphosis Clear Regular rate and rhythm, no murmurs or gallops Abd-soft with active BS without hepatomegaly or midline pulsation Femoral pulses 2+ distal pulses intact No Clubbing cyanosis edema Skin-warm and dry LN-neg submandibular and supraclavicular A & Oriented CN 3-12 normal  Grossly normal sensory and motor function Affect engaging . Electrocardiogram dated 22 August demonstrated sinus rhythm at 67 Intervals 0.15/0.10/24 2 Axis is 46 Otherwise normal

## 2011-06-16 NOTE — Assessment & Plan Note (Signed)
The patient has known long-standing modest coronary artery disease. However, the patient has no major predictors of risk notwithstanding. His functional capacity is excellent; he is a long long-standing statin therapy. He had Standard treadmill 4 years ago that was unrevealing. I think his risks are acceptable

## 2011-06-16 NOTE — Assessment & Plan Note (Signed)
As above.

## 2011-06-17 NOTE — H&P (Addendum)
[name], [name]                 ACCOUNT NO.:  [id number]  MEDICAL RECORD NO.:  [id number]  LOCATION:  PADM                         FACILITY:  [facility]  PHYSICIAN:  Madlyn Frankel. Charlann Boxer, M.D.  DATE OF BIRTH:  08/27/1951  DATE OF ADMISSION:  06/15/2011 DATE OF DISCHARGE:                             HISTORY & PHYSICAL   DATE OF SURGERY:  June 20, 2011.  DIAGNOSIS:  Right knee pain, status post previous right total knee arthroplasty (loosening of prosthetic joint).  HISTORY OF PRESENT ILLNESS:  Patient is a 60 year old male who had a right total knee replacement in 2001.  Patient has been complaining of 2 years' worth of pain from that knee.  Followup x-rays revealed loosening of the components of the total knee.  Patient has been living with the pain, trying to putting off surgery.  The pain has progressively worsened, and at this point, it is too overwhelming to ignore any more. Patient's options have been discussed with Dr. Charlann Boxer.  Patient wishes to proceed with surgery.  Risks, benefits, and expectations of the procedure were discussed with the patient.  Patient understands risks, benefits, and expectations and wishes to proceed with a right total knee replacement revision by Dr. Charlann Boxer on June 20, 2011.  PAST MEDICAL HISTORY: 1. Borderline diabetes. 2. Hypercholesteremia. 3. Hypothyroidism.  PAST SURGICAL HISTORY: 1. Both shoulder arthroscopies. 2. Bilateral knee replacements. 3. Oral surgery (all teeth removed).  MEDICATIONS: 1. Levothyroxine 50 mcg 3 tablets q. day. 2. Metformin 500 mg one p.o. q. day. 3. Simvastatin 40 mg one p.o. q. day.  ALLERGIES:  No known drug allergies.  SOCIAL HISTORY:  Patient denies use of both alcohol and tobacco.  REVIEW OF SYSTEMS:  Patient does complain of joint pain, swelling, muscular pain, back pain, spasms, morning stiffness, and occasional headache.  Otherwise, unremarkable.  PHYSICAL EXAMINATION:  GENERAL:  Patient is a  60 year old male, in no acute distress. VITAL SIGNS:  Stable.  Blood pressure in the right arm is 118/78, respirations 14, pulse 80. HEENT:  Pupils equal, round, and reactive to light and accommodation. Throat is clear.  Patient does have plates on both top and bottom. NECK:  Supple.  No JVD.  No noted carotid bruits.  No lymphadenopathy noted. CARDIAC:  Normal-appearing S1, S2.  No murmur appreciated. RESPIRATORY : Lungs clear to auscultation bilaterally. NEURO:  Patient is alert and oriented x3. EXTREMITIES:  Ortho is pertaining to the right knee.  Patient does have generalized pain on palpation of the entirety of the anterior tibial surface.  Patient does have full extension, but is only able to flex the knee to about 100 degrees.  Patient is distally neurovascular intact. He has +2 dorsalis pedis pulse.  Good sensation to light touch.  Patient does have a previous well-healed scar over the anterior aspect of the right knee.  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  PLAN:  Patient will undergo revision of the right total knee replacement per Dr. Charlann Boxer on June 20, 2011.  Patient understands risks, benefits, and expectations and wishes to proceed.    ______________________________ Lanney Gins, PA   ______________________________ Madlyn Frankel.  Charlann Boxer, M.D.    MB/MEDQ  D:  06/15/2011  T:  06/16/2011  Job:  [id number]  Electronically Signed by Lanney Gins PA on 06/16/2011 12:58:48 PM Electronically Signed by Durene Romans M.D. on 06/17/2011 07:58:42 AM

## 2011-06-18 LAB — MRSA CULTURE

## 2011-06-20 DIAGNOSIS — E039 Hypothyroidism, unspecified: Secondary | ICD-10-CM | POA: Diagnosis present

## 2011-06-20 DIAGNOSIS — E119 Type 2 diabetes mellitus without complications: Secondary | ICD-10-CM | POA: Diagnosis present

## 2011-06-20 DIAGNOSIS — Z96659 Presence of unspecified artificial knee joint: Secondary | ICD-10-CM

## 2011-06-20 DIAGNOSIS — T84099A Other mechanical complication of unspecified internal joint prosthesis, initial encounter: Principal | ICD-10-CM | POA: Diagnosis present

## 2011-06-20 DIAGNOSIS — E669 Obesity, unspecified: Secondary | ICD-10-CM | POA: Diagnosis present

## 2011-06-20 DIAGNOSIS — E7800 Pure hypercholesterolemia, unspecified: Secondary | ICD-10-CM | POA: Diagnosis present

## 2011-06-20 DIAGNOSIS — Y831 Surgical operation with implant of artificial internal device as the cause of abnormal reaction of the patient, or of later complication, without mention of misadventure at the time of the procedure: Secondary | ICD-10-CM | POA: Diagnosis present

## 2011-06-20 DIAGNOSIS — M1710 Unilateral primary osteoarthritis, unspecified knee: Secondary | ICD-10-CM | POA: Diagnosis present

## 2011-06-20 DIAGNOSIS — Z01812 Encounter for preprocedural laboratory examination: Secondary | ICD-10-CM

## 2011-06-20 LAB — GLUCOSE, CAPILLARY
Glucose-Capillary: 120 mg/dL — ABNORMAL HIGH (ref 70–99)
Glucose-Capillary: 131 mg/dL — ABNORMAL HIGH (ref 70–99)
Glucose-Capillary: 166 mg/dL — ABNORMAL HIGH (ref 70–99)

## 2011-06-20 LAB — TYPE AND SCREEN

## 2011-06-21 LAB — CBC
HCT: 35.4 % — ABNORMAL LOW (ref 39.0–52.0)
MCH: 28.4 pg (ref 26.0–34.0)
MCHC: 34.2 g/dL (ref 30.0–36.0)
MCV: 83.1 fL (ref 78.0–100.0)
Platelets: 202 10*3/uL (ref 150–400)
RDW: 14.5 % (ref 11.5–15.5)
WBC: 14 10*3/uL — ABNORMAL HIGH (ref 4.0–10.5)

## 2011-06-21 LAB — BASIC METABOLIC PANEL
BUN: 11 mg/dL (ref 6–23)
Calcium: 8.4 mg/dL (ref 8.4–10.5)
Chloride: 100 mEq/L (ref 96–112)
Creatinine, Ser: 0.61 mg/dL (ref 0.50–1.35)
GFR calc Af Amer: >60 mL/min (ref >60)

## 2011-06-21 LAB — GLUCOSE, CAPILLARY: Glucose-Capillary: 121 mg/dL — ABNORMAL HIGH (ref 70–99)

## 2011-06-22 LAB — CBC
HCT: 35.3 % — ABNORMAL LOW (ref 39.0–52.0)
MCH: 27.4 pg (ref 26.0–34.0)
MCV: 84 fL (ref 78.0–100.0)
RDW: 14.7 % (ref 11.5–15.5)
WBC: 16.1 10*3/uL — ABNORMAL HIGH (ref 4.0–10.5)

## 2011-06-22 LAB — BASIC METABOLIC PANEL
BUN: 10 mg/dL (ref 6–23)
CO2: 27 mEq/L (ref 19–32)
Calcium: 8.5 mg/dL (ref 8.4–10.5)
Chloride: 98 mEq/L (ref 96–112)
Creatinine, Ser: 0.67 mg/dL (ref 0.50–1.35)
GFR calc Af Amer: >60 mL/min (ref >60)

## 2011-06-24 NOTE — Op Note (Signed)
[name], [name] NO.:  [id number]  MEDICAL RECORD NO.:  [id number]  LOCATION:  1609                         FACILITY:  [facility]  PHYSICIAN:  [name], M.D.  DATE OF BIRTH:  [birth year]  DATE OF PROCEDURE:  06/20/2011 DATE OF DISCHARGE:                              OPERATIVE REPORT   PREOPERATIVE DIAGNOSIS:  Failed right total knee replacement.  POSTOPERATIVE DIAGNOSIS:  Failed right total knee replacement.  FINDINGS:  This is an aseptic failure with some evidence of metal staining or metallosis to the synovial lining, but no obvious source of this, though there was a metal-backed patella.  I did not see any exposed patella and the polyethylene was stable unless it had rotated out of place and was causing some metal-on-metal contact.  PROCEDURE:  Revision right total knee replacement utilizing DePuy component size 4 TC3 femur, +2 bolt 5 degree right with a 13 x 30 stem. A 4 mm distal medial and lateral augment and 4 mm posterior medial augment.  The tibia size was a size 5 M.B.T. revision tray with a 29 cemented sleeve, 5 mm lateral augments, 10 mm medial augment.  A 10 mm insert to match the 4 femur and a 41 patellar button.  SURGEON:  Jeff Frankel. Charlann Boxer, MD  ASSISTANT:  Jeff Gins, PA-C  ANESTHESIA:  General plus a preoperative regional femoral nerve block.  COMPLICATIONS:  None.  SPECIMEN:  None.  DRAINS:  One Hemovac.  TOURNIQUET TIME:  105 minutes at 250 mmHg.  INDICATION FOR PROCEDURE:  [name] is a 60 year old patient of mine who I have been following for sometime.  He has had a history of bilateral knee osteoarthritis with his right total knee replacement done prior to me meeting him.  He had undergone within the last 2 years a left total knee replacement, he had done very well with.  He has had persistent problems with this right knee.  Previous workup of this right knee has included a bone scan which revealed tibial  loosening.  He has had progressive worsening of symptoms of his right knee with increased pain with any kind of activity.  He at this point wished to proceed. Risks and benefits were discussed and reviewed.  Consent was obtained after reviewing risks of infection, DVT, component failure, need for further revision, persistent pain postoperatively.  Consent was obtained.  PROCEDURE IN DETAIL:  The patient was brought to operative theater. Once adequate anesthesia, preoperative antibiotics, Ancef administered, the patient was positioned supine with a right thigh tourniquet placed. The right lower extremity was then prepped and draped in sterile fashion with the right leg placed in [facility] leg holder.  A time-out was performed identifying the patient, planned procedure, and extremity.  The patient's old incision was identified and a portion was utilized as it was quite extensive.  Soft tissue dissection planes were created and median arthrotomy was made removing Ethibond sutures on the way in.  Synovial fluid was cleared, metal staining of all synovium clearly evident.  At this point, attention was first directed to the medial, lateral, and suprapatellar synovectomy.  Once this was done, the knee was flexed, I  removed the old polyethylene insert.  The tibia was subluxated anteriorly following proximal medial field.  At this point, we used a small thin ACL saw blade and undermined the proximal tibia component which was noted to be loose.  At this point, attention was directed to the femoral component.  The ACL saw blade was used to undermine the cement prosthetic interface medially and laterally along the edges of the femoral component.  Femoral component was then removed without significant bone loss.  At this point, the tibia subluxated again and the tibial component removed.  At this point, attention was directed removing cement on the tibia. Once this was done and cement was  adequately debrided off the femoral and the tibial surfaces the femoral canal was opened and reamed up to 15 mm by hand.  The proximal femur based on cement removal did require sleeve to help with rotational control.  Based on the position of the old tibial component of the varus nature, I figured out it was going to be using at least some sort of an offset augment medially.  The extramedullary guide was positioned on the proximal tibia with 2 degrees of posterior slope and straight down the shaft of the tibia. After freshening up the contralateral side of the knee, we identified there could be a 5 mm augment difference.  I removed 5 mm bone off this proximal medial tibia.  At this point, I then drilled and broached the proximal tibia for the sleeve and M.B.T. revision tray.  The tibial tray was then inserted and checked for alignment and found to be straight down the spine of the tibia.  Given this, the attention was now directed to the femur with the intramedullary rod in place.  The distal femoral cutting block was positioned on the distal femur and only a little bit of bone removed off the distal femur to allow for good bony contact.  No evidence of an asymmetric need for augments.  At this point, I sized the femoral component to be a size 4 compared to what was taken out of the femur prior.  With a +2 bolt, I needed to remove a little bit of bone to get a 4 mm augment on the medial posterior side, but none laterally.  The chamfer cuts were made followed by a box cut for TC3 component.  A trial reduction now carried out with a size 4 TC3 femur with this posterior medial augment.  At this point, we decided that the 4 mm medial and lateral distal augments would help to improve the extension gap whereas the flexion gap was not stable.  I also made a determination as I was flexed in the knee with the old patella in place, it was a metal-backed patella that the patella had  to be rise as there was too much movement of the polyethylene and jumping. At this point with towel clamps placed, synovectomy was carried around the patella.  Using a thin ACL saw blade, I undermined the metal-backed portion of the patella.  Remaining patella was approximately 13 mm.  Lug holes were drilled for 41 patellar button.  At this point, all trial components were removed.  Cement restrictor was placed in the femoral and tibial canals.  The knee was copiously irrigated with normal saline solution and the synovial capsule junction was injected with 0.25% Marcaine with epinephrine and 1 cc of Toradol. Final components were opened on the back field as noted and configured. Once this was done,  3 batches of gentamicin-impregnated cement was made and injected.  The final components were then cemented into position. The knee was brought to extension with a 10 mm insert and extruded cement was removed.  Once the cement had fully cured and excess cement was removed throughout the knee, the final 10 mm insert was chosen.  The knee was re-irrigated with normal saline solution.  A medium Hemovac drain was placed deep.  The tourniquet was let down after 105 minutes.  At this point, the extensor mechanism was reapproximated using #1 Vicryl with the knee in flexion.  The remainder of the wound was closed with 2- 0 Vicryl and staples on the skin.  Skin was cleaned, dried, and dressed sterilely using Xeroform and bulky sterile wrap. He was then extubated and brought to recovery room in stable condition.     Jeff Frankel Charlann Freeman, M.D.     MDO/MEDQ  D:  06/20/2011  T:  06/21/2011  Job:  [id number]  Electronically Signed by Durene Romans M.D. on 06/24/2011 09:02:11 AM

## 2011-07-02 NOTE — Discharge Summary (Signed)
[name], [name] NO.:  [id number]  MEDICAL RECORD NO.:  [id number]  LOCATION:  1609                         FACILITY:  [facility]  PHYSICIAN:  Madlyn Frankel. Charlann Boxer, M.D.  DATE OF BIRTH:  Apr 05, 1951  DATE OF ADMISSION:  06/20/2011 DATE OF DISCHARGE:  06/22/2011                              DISCHARGE SUMMARY   PROCEDURE:  Right total knee revision.  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  DISCHARGE DIAGNOSES: 1. Status post right total knee revision. 2. Borderline diabetes. 3. Hypercholesteremia. 4. Hypothyroidism.  HISTORY OF PRESENT ILLNESS:  The patient is a 60 year old male who has had right total knee replacement in 2001.  The patient is complaining of 2 years worth of pain from the knee.  Followup x-rays revealed loosening of the components of the total knee.  The patient was living with pain, trying to put off the surgery.  Pain has progressively worsened, at this point it is too overwhelming to ignore.  The patient's options had been discussed with Dr. Charlann Boxer.  The patient wished to proceed with surgery. Risks, benefits and expectations were discussed with the patient.  The patient understood the risks, benefits and expectations and wished to proceed with surgery.  HOSPITAL COURSE:  The patient underwent the above-stated procedure on June 20, 2011.  The patient tolerated the procedure well, was brought to the recovery room in good condition and subsequently to the floor.  Postop day #1, 06/21/2011, the patient was doing well, no events.  Pain well controlled.  He is afebrile, vital signs stable. H and H of 12.1/35.4.  Dressings good; clean, dry and intact, distally neurovascular intact.  Hemovac and IV were both discontinued.  The patient had physical therapy.  Postop day #2, 06/22/2011, the patient doing well, no events.  The patient getting up on with the  walker and getting around very well.  The patient's pain is well-controlled.  H and H is 11.5/35.3.  Dressing is good, dry and intact, distally neurovascular intact.  The patient had physical therapy.  The patient was felt to be doing well enough to be discharged home.  CONDITION ON DISCHARGE:  Good.  DISCHARGE INSTRUCTIONS:  The patient will change his dressing with gauze and tape.  The patient will keep the area dry and clean until followup with Dr. Charlann Boxer in 2 weeks.  The patient will be weightbearing as tolerated.  The patient has already been given his postop medications, given a prescription for Norco 7.5/325 for leaving.  The patient is to call if any questions or concerns.  DISCHARGE MEDICATIONS: 1. Enteric-coated aspirin 325 mg one p.o. b.i.d. x4 weeks. 2. Benadryl 25 mg one p.o. q.4 h p.r.n. 3. Colace 100 mg one p.o. b.i.d. constipation. 4. Iron sulfate 325 mg one p.o. t.i.d. x2 to 3 weeks. 5. Norco 7.5/325 one to two p.o. q. 4 to 6 hours p.r.n. pain. 6. MiraLax 17 g 1 p.o. daily constipation. 7. Robaxin 500 mg 1 p.o. q.6 h p.r.n. muscle spasm. 8. Levothyroxine 50 mcg 3 tablets p.o. q. a.m. 9. Metformin 500 mg 1 p.o. daily. 10.Simvastatin 40 mg 1  p.o. daily.    ______________________________ Lanney Gins, PA   ______________________________ Madlyn Frankel. Charlann Boxer, M.D.    MB/MEDQ  D:  06/22/2011  T:  06/22/2011  Job:  [id number]  Electronically Signed by Lanney Gins PA on 06/30/2011 14:78:29 PM Electronically Signed by Durene Romans M.D. on 07/02/2011 07:08:49 AM

## 2011-07-21 LAB — COMPREHENSIVE METABOLIC PANEL
ALT: 14
AST: 20
Albumin: 2.8 — ABNORMAL LOW
Alkaline Phosphatase: 58
Alkaline Phosphatase: 89
BUN: 4 — ABNORMAL LOW
BUN: 9
CO2: 24
CO2: 28
Chloride: 102
Chloride: 99
Creatinine, Ser: 0.85
GFR calc non Af Amer: >60
GFR calc non Af Amer: >60
Glucose, Bld: 122 — ABNORMAL HIGH
Glucose, Bld: 156 — ABNORMAL HIGH
Potassium: 3.9
Potassium: 4.4
Sodium: 134 — ABNORMAL LOW
Total Bilirubin: 1
Total Protein: 5.8 — ABNORMAL LOW
Total Protein: 6.3

## 2011-07-21 LAB — URINALYSIS, ROUTINE W REFLEX MICROSCOPIC
Bilirubin Urine: NEGATIVE
Glucose, UA: NEGATIVE
Hgb urine dipstick: NEGATIVE
Specific Gravity, Urine: 1.012

## 2011-07-21 LAB — CBC
HCT: 40.2
HCT: 44
Hemoglobin: 13.4
Hemoglobin: 14
MCHC: 34.4
MCV: 83.1
Platelets: 248
RBC: 4.68
RBC: 4.86
RBC: 5.3
RDW: 14.6
WBC: 18.8 — ABNORMAL HIGH
WBC: 24.9 — ABNORMAL HIGH

## 2011-07-21 LAB — DIFFERENTIAL
Basophils Absolute: 0.2 — ABNORMAL HIGH
Eosinophils Relative: 1
Lymphocytes Relative: 12
Neutro Abs: 14.9 — ABNORMAL HIGH
Neutrophils Relative %: 79 — ABNORMAL HIGH

## 2011-07-21 LAB — LIPID PANEL
HDL: 44
LDL Cholesterol: 112 — ABNORMAL HIGH
Total CHOL/HDL Ratio: 4
Triglycerides: 109
VLDL: 22

## 2011-07-21 LAB — POCT CARDIAC MARKERS: Myoglobin, poc: 57

## 2011-07-21 LAB — LIPASE, BLOOD
Lipase: 268 — ABNORMAL HIGH
Lipase: 57

## 2011-07-21 LAB — BASIC METABOLIC PANEL
CO2: 28
Calcium: 8.3 — ABNORMAL LOW
GFR calc Af Amer: >60
GFR calc non Af Amer: >60
Sodium: 134 — ABNORMAL LOW

## 2011-07-21 LAB — APTT: aPTT: 28

## 2011-07-25 LAB — DIFFERENTIAL
Basophils Relative: 0
Eosinophils Absolute: 0.1
Monocytes Absolute: 1.3 — ABNORMAL HIGH
Monocytes Relative: 8

## 2011-07-25 LAB — CBC
HCT: 44.8
HCT: 36.9 — ABNORMAL LOW
Hemoglobin: 15.2
Hemoglobin: 12 — ABNORMAL LOW
Hemoglobin: 12.4 — ABNORMAL LOW
MCHC: 33.9
MCV: 84.3
RBC: 5.31
RBC: 4.31
RDW: 14.2
WBC: 14 — ABNORMAL HIGH

## 2011-07-25 LAB — BASIC METABOLIC PANEL
CO2: 28
Calcium: 8 — ABNORMAL LOW
Calcium: 8.1 — ABNORMAL LOW
Chloride: 102
Creatinine, Ser: 0.86
GFR calc Af Amer: >60
GFR calc Af Amer: >60
GFR calc Af Amer: >60
GFR calc non Af Amer: >60
GFR calc non Af Amer: >60
Glucose, Bld: 120 — ABNORMAL HIGH
Glucose, Bld: 123 — ABNORMAL HIGH
Potassium: 4.1
Potassium: 3.8
Sodium: 139
Sodium: 134 — ABNORMAL LOW
Sodium: 135

## 2011-07-25 LAB — TYPE AND SCREEN
ABO/RH(D): O POS
Antibody Screen: NEGATIVE

## 2011-07-25 LAB — URINALYSIS, ROUTINE W REFLEX MICROSCOPIC
Bilirubin Urine: NEGATIVE
Hgb urine dipstick: NEGATIVE
Nitrite: NEGATIVE
Specific Gravity, Urine: 1.023
pH: 5

## 2011-08-02 LAB — POCT CARDIAC MARKERS
Myoglobin, poc: 87.6
Operator id: 272551
Operator id: 272551
Troponin i, poc: <0.05

## 2011-08-02 LAB — POCT I-STAT CREATININE: Operator id: 272551

## 2011-08-02 LAB — I-STAT 8, (EC8 V) (CONVERTED LAB)
BUN: 12
Chloride: 105
Glucose, Bld: 165 — ABNORMAL HIGH
Hemoglobin: 15.3
Potassium: 4.2
Sodium: 138
pH, Ven: 7.378 — ABNORMAL HIGH

## 2011-10-06 ENCOUNTER — Other Ambulatory Visit: Payer: Self-pay | Admitting: Dermatology

## 2012-03-05 ENCOUNTER — Ambulatory Visit: Payer: Self-pay | Admitting: Internal Medicine

## 2012-04-16 ENCOUNTER — Encounter: Payer: Self-pay | Admitting: Internal Medicine

## 2012-09-17 ENCOUNTER — Ambulatory Visit (INDEPENDENT_AMBULATORY_CARE_PROVIDER_SITE_OTHER): Payer: BC Managed Care – PPO | Admitting: Internal Medicine

## 2012-09-17 ENCOUNTER — Encounter: Payer: Self-pay | Admitting: Internal Medicine

## 2012-09-17 VITALS — BP 120/70 | HR 68 | Temp 97.8°F | Resp 16 | Ht 70.0 in | Wt 257.0 lb

## 2012-09-17 DIAGNOSIS — Z0000 Encounter for general adult medical examination without abnormal findings: Secondary | ICD-10-CM

## 2012-09-17 DIAGNOSIS — E785 Hyperlipidemia, unspecified: Secondary | ICD-10-CM

## 2012-09-17 DIAGNOSIS — R7302 Impaired glucose tolerance (oral): Secondary | ICD-10-CM

## 2012-09-17 DIAGNOSIS — E039 Hypothyroidism, unspecified: Secondary | ICD-10-CM

## 2012-09-17 DIAGNOSIS — R7309 Other abnormal glucose: Secondary | ICD-10-CM

## 2012-09-17 LAB — POCT UA - MICROSCOPIC ONLY
Bacteria, U Microscopic: NEGATIVE
Crystals, Ur, HPF, POC: NEGATIVE

## 2012-09-17 LAB — IFOBT (OCCULT BLOOD): IFOBT: POSITIVE

## 2012-09-17 LAB — POCT URINALYSIS DIPSTICK
Bilirubin, UA: NEGATIVE
Ketones, UA: NEGATIVE
Leukocytes, UA: NEGATIVE
Protein, UA: NEGATIVE
Spec Grav, UA: 1.02

## 2012-09-17 NOTE — Progress Notes (Signed)
  Subjective:    Patient ID: [name], male    DOB: 1951/09/02, 61 y.o.   MRN: [id number]  HPI Glucose intol controlled Hypothyroid ok CAD stable No fhx cancer Hx of gout Dizzy caused by vest stone  Review of Systems  Constitutional: Negative.   Eyes: Negative.   Respiratory: Negative.   Cardiovascular: Positive for leg swelling.  Gastrointestinal: Negative.   Genitourinary: Negative.   Musculoskeletal: Positive for joint swelling and arthralgias.  Neurological: Positive for dizziness and headaches.  Hematological: Negative.   Psychiatric/Behavioral: Negative.        Objective:   Physical Exam  Vitals reviewed. Constitutional: He is oriented to person, place, and time. He appears well-nourished. No distress.  HENT:  Right Ear: External ear normal.  Left Ear: External ear normal.  Nose: Nose normal.  Mouth/Throat: Oropharynx is clear and moist.  Eyes: EOM are normal. Pupils are equal, round, and reactive to light.  Neck: Normal range of motion. Neck supple. No tracheal deviation present. No thyromegaly present.  Cardiovascular: Normal rate, regular rhythm and normal heart sounds.   Pulmonary/Chest: Effort normal and breath sounds normal.  Abdominal: Soft. Bowel sounds are normal. He exhibits no mass. There is no tenderness. There is no rebound.  Genitourinary: Rectum normal, prostate normal and penis normal.  Musculoskeletal: He exhibits edema and tenderness.  Lymphadenopathy:    He has no cervical adenopathy.  Neurological: He is alert and oriented to person, place, and time. Coordination normal.  Skin: Skin is warm.  Psychiatric: He has a normal mood and affect. His behavior is normal. Thought content normal.     Results for orders placed in visit on 09/17/12  GLUCOSE, POCT (MANUAL RESULT ENTRY)      Component Value Range   POC Glucose 109 (*) 70 - 99 mg/dl  POCT GLYCOSYLATED HEMOGLOBIN (HGB A1C)      Component Value Range   Hemoglobin A1C 6.0    POCT UA -  MICROSCOPIC ONLY      Component Value Range   WBC, Ur, HPF, POC 0-1     RBC, urine, microscopic 0-4     Bacteria, U Microscopic neg     Mucus, UA neg     Epithelial cells, urine per micros 0-2     Crystals, Ur, HPF, POC neg     Casts, Ur, LPF, POC neg     Yeast, UA neg    POCT URINALYSIS DIPSTICK      Component Value Range   Color, UA yellow     Clarity, UA clear     Glucose, UA neg     Bilirubin, UA neg     Ketones, UA neg     Spec Grav, UA 1.020     Blood, UA small     pH, UA 5.5     Protein, UA neg     Urobilinogen, UA 0.2     Nitrite, UA neg     Leukocytes, UA Negative          Assessment & Plan:  RF meds 1 yr Lose weight

## 2012-09-17 NOTE — Patient Instructions (Addendum)

## 2012-09-18 LAB — LIPID PANEL
Cholesterol: 138 mg/dL (ref 0–200)
LDL Cholesterol: 77 mg/dL (ref 0–99)
Total CHOL/HDL Ratio: 3.3 Ratio
Triglycerides: 97 mg/dL (ref <150)
VLDL: 19 mg/dL (ref 0–40)

## 2012-09-18 LAB — COMPREHENSIVE METABOLIC PANEL
ALT: 22 U/L (ref 0–53)
Albumin: 4.7 g/dL (ref 3.5–5.2)
Alkaline Phosphatase: 69 U/L (ref 39–117)
CO2: 24 mEq/L (ref 19–32)
Glucose, Bld: 98 mg/dL (ref 70–99)
Potassium: 4.1 mEq/L (ref 3.5–5.3)
Sodium: 137 mEq/L (ref 135–145)
Total Bilirubin: 1 mg/dL (ref 0.3–1.2)
Total Protein: 7.3 g/dL (ref 6.0–8.3)

## 2012-09-18 LAB — CBC WITH DIFFERENTIAL/PLATELET
HCT: 44.3 % (ref 39.0–52.0)
Hemoglobin: 15.3 g/dL (ref 13.0–17.0)
Lymphocytes Relative: 23 % (ref 12–46)
Lymphs Abs: 2.2 10*3/uL (ref 0.7–4.0)
MCHC: 34.5 g/dL (ref 30.0–36.0)
Monocytes Absolute: 0.9 10*3/uL (ref 0.1–1.0)
Monocytes Relative: 10 % (ref 3–12)
Neutro Abs: 6 10*3/uL (ref 1.7–7.7)
Neutrophils Relative %: 65 % (ref 43–77)
RBC: 5.43 MIL/uL (ref 4.22–5.81)
WBC: 9.2 10*3/uL (ref 4.0–10.5)

## 2012-09-18 LAB — PSA: PSA: 1.56 ng/mL (ref <=4.00)

## 2012-09-18 LAB — TSH: TSH: 0.702 u[IU]/mL (ref 0.350–4.500)

## 2013-02-22 ENCOUNTER — Other Ambulatory Visit: Payer: Self-pay | Admitting: Podiatry

## 2013-02-22 DIAGNOSIS — M25571 Pain in right ankle and joints of right foot: Secondary | ICD-10-CM

## 2013-02-22 DIAGNOSIS — M79671 Pain in right foot: Secondary | ICD-10-CM

## 2013-02-28 ENCOUNTER — Ambulatory Visit
Admission: RE | Admit: 2013-02-28 | Discharge: 2013-02-28 | Disposition: A | Discharge status: Home / Self Care | Payer: PRIVATE HEALTH INSURANCE | Source: Ambulatory Visit | Attending: Podiatry | Admitting: Podiatry

## 2013-02-28 ENCOUNTER — Other Ambulatory Visit: Payer: BC Managed Care – PPO

## 2013-02-28 DIAGNOSIS — M25571 Pain in right ankle and joints of right foot: Secondary | ICD-10-CM

## 2013-02-28 DIAGNOSIS — M79671 Pain in right foot: Secondary | ICD-10-CM

## 2013-08-26 ENCOUNTER — Encounter: Payer: Self-pay | Admitting: Podiatry

## 2013-08-26 ENCOUNTER — Ambulatory Visit (INDEPENDENT_AMBULATORY_CARE_PROVIDER_SITE_OTHER): Payer: PRIVATE HEALTH INSURANCE | Admitting: Podiatry

## 2013-08-26 ENCOUNTER — Ambulatory Visit (INDEPENDENT_AMBULATORY_CARE_PROVIDER_SITE_OTHER): Payer: PRIVATE HEALTH INSURANCE

## 2013-08-26 VITALS — BP 147/80 | HR 92 | Resp 16 | Ht 71.0 in | Wt 262.0 lb

## 2013-08-26 DIAGNOSIS — R52 Pain, unspecified: Secondary | ICD-10-CM

## 2013-08-26 DIAGNOSIS — M7750 Other enthesopathy of unspecified foot: Secondary | ICD-10-CM

## 2013-08-26 NOTE — Progress Notes (Signed)
Subjective:     Patient ID: [name], male   DOB: 08/29/1951, 62 y.o.   MRN: [id number]  HPI patient presents stating my right ankle is doing pretty good but it does feel weak at times. Several months after having foot surgery of the right peroneal tendon group and removal of ossicle   Review of Systems  All other systems reviewed and are negative.       Objective:   Physical Exam  Nursing note and vitals reviewed. Constitutional: He is oriented to person, place, and time.  Cardiovascular: Intact distal pulses.   Musculoskeletal: Normal range of motion.  Neurological: He is oriented to person, place, and time.   patient continues to have mild edema in the lateral tendon group muscle strength was found to be adequate with no deficient     Assessment:     Doing well after peroneal repair and removal of ossicle    Plan:     Final x-ray taken and advice on muscle strengthening activities and continued usage of low-cut boots

## 2013-09-23 ENCOUNTER — Encounter: Payer: Self-pay | Admitting: Internal Medicine

## 2013-09-23 ENCOUNTER — Ambulatory Visit (INDEPENDENT_AMBULATORY_CARE_PROVIDER_SITE_OTHER): Payer: PRIVATE HEALTH INSURANCE | Admitting: Internal Medicine

## 2013-09-23 VITALS — BP 120/82 | HR 78 | Temp 98.1°F | Resp 16 | Ht 70.5 in | Wt 266.2 lb

## 2013-09-23 DIAGNOSIS — E739 Lactose intolerance, unspecified: Secondary | ICD-10-CM

## 2013-09-23 DIAGNOSIS — R42 Dizziness and giddiness: Secondary | ICD-10-CM

## 2013-09-23 DIAGNOSIS — Z0000 Encounter for general adult medical examination without abnormal findings: Secondary | ICD-10-CM

## 2013-09-23 DIAGNOSIS — Z79899 Other long term (current) drug therapy: Secondary | ICD-10-CM

## 2013-09-23 DIAGNOSIS — Z23 Encounter for immunization: Secondary | ICD-10-CM

## 2013-09-23 DIAGNOSIS — E039 Hypothyroidism, unspecified: Secondary | ICD-10-CM

## 2013-09-23 DIAGNOSIS — E785 Hyperlipidemia, unspecified: Secondary | ICD-10-CM

## 2013-09-23 DIAGNOSIS — E7439 Other disorders of intestinal carbohydrate absorption: Secondary | ICD-10-CM

## 2013-09-23 LAB — CBC WITH DIFFERENTIAL/PLATELET
Eosinophils Relative: 3 % (ref 0–5)
HCT: 42.7 % (ref 39.0–52.0)
Hemoglobin: 14.8 g/dL (ref 13.0–17.0)
Lymphocytes Relative: 30 % (ref 12–46)
MCV: 77.8 fL — ABNORMAL LOW (ref 78.0–100.0)
Monocytes Absolute: 1 10*3/uL (ref 0.1–1.0)
Monocytes Relative: 10 % (ref 3–12)
Neutro Abs: 5.5 10*3/uL (ref 1.7–7.7)
WBC: 9.7 10*3/uL (ref 4.0–10.5)

## 2013-09-23 LAB — COMPREHENSIVE METABOLIC PANEL
ALT: 19 U/L (ref 0–53)
CO2: 24 mEq/L (ref 19–32)
Calcium: 9.1 mg/dL (ref 8.4–10.5)
Chloride: 103 mEq/L (ref 96–112)
Creat: 0.96 mg/dL (ref 0.50–1.35)
Glucose, Bld: 141 mg/dL — ABNORMAL HIGH (ref 70–99)
Total Bilirubin: 0.5 mg/dL (ref 0.3–1.2)

## 2013-09-23 LAB — POCT UA - MICROSCOPIC ONLY
Bacteria, U Microscopic: NEGATIVE
Epithelial cells, urine per micros: NEGATIVE
WBC, Ur, HPF, POC: NEGATIVE
Yeast, UA: NEGATIVE

## 2013-09-23 LAB — POCT URINALYSIS DIPSTICK
Bilirubin, UA: NEGATIVE
Glucose, UA: NEGATIVE
Ketones, UA: NEGATIVE
Leukocytes, UA: NEGATIVE
Spec Grav, UA: 1.02

## 2013-09-23 LAB — GLUCOSE, POCT (MANUAL RESULT ENTRY): POC Glucose: 146 mg/dl — AB (ref 70–99)

## 2013-09-23 LAB — LIPID PANEL
Cholesterol: 168 mg/dL (ref 0–200)
Total CHOL/HDL Ratio: 3.5 Ratio
Triglycerides: 94 mg/dL (ref <150)
VLDL: 19 mg/dL (ref 0–40)

## 2013-09-23 LAB — PSA: PSA: 1.71 ng/mL (ref <=4.00)

## 2013-09-23 NOTE — Patient Instructions (Signed)

## 2013-09-23 NOTE — Progress Notes (Signed)
   Subjective:    Patient ID: [name], male    DOB: [birth year], 62 y.o.   MRN: [id number]  HPI    Review of Systems  Constitutional: Negative.   HENT: Negative.   Eyes: Positive for redness.  Respiratory: Negative.   Cardiovascular: Negative.   Gastrointestinal: Negative.   Endocrine: Negative.   Genitourinary: Negative.   Musculoskeletal: Positive for arthralgias and back pain.  Allergic/Immunologic: Negative.   Neurological: Positive for dizziness.  Hematological: Negative.   Psychiatric/Behavioral: Negative.        Objective:   Physical Exam        Assessment & Plan:

## 2013-09-23 NOTE — Progress Notes (Signed)
Subjective:    Patient ID: [name], male    DOB: 05-07-1951, 62 y.o.   MRN: [id number]  HPI Doing well eventho just had fooy surgery. Does have new dizzyness assoc with movement. CT head normal done by his doc at work. To see neurology if sxs persist   Review of Systems  Constitutional: Positive for unexpected weight change.  HENT: Negative.   Eyes: Negative.   Respiratory: Negative.   Cardiovascular: Negative.   Gastrointestinal: Negative.   Endocrine: Negative.   Genitourinary: Negative.   Musculoskeletal: Positive for arthralgias, back pain, gait problem and joint swelling.  Allergic/Immunologic: Negative.   Neurological: Positive for dizziness.  Hematological: Negative.   Psychiatric/Behavioral: Negative.        Objective:   Physical Exam  Constitutional: He is oriented to person, place, and time. He appears well-nourished.  HENT:  Head: Normocephalic.  Right Ear: External ear normal.  Left Ear: External ear normal.  Nose: Nose normal.  Mouth/Throat: Oropharynx is clear and moist.  Eyes: Conjunctivae and EOM are normal. Pupils are equal, round, and reactive to light. No scleral icterus.  Neck: Normal range of motion. Neck supple. No tracheal deviation present. No thyromegaly present.  Cardiovascular: Normal rate, regular rhythm, normal heart sounds and intact distal pulses.   Pulmonary/Chest: Effort normal and breath sounds normal.  Abdominal: Soft. Bowel sounds are normal. He exhibits no distension and no mass. There is no tenderness.  Genitourinary: Rectum normal, prostate normal and penis normal.  Musculoskeletal: He exhibits edema and tenderness.  Lymphadenopathy:    He has no cervical adenopathy.  Neurological: He is alert and oriented to person, place, and time. He has normal strength. No cranial nerve deficit or sensory deficit. He exhibits normal muscle tone. Coordination and gait abnormal.  Balance normal on left foot Surgery on right foot  Skin: No  rash noted.  Psychiatric: He has a normal mood and affect. His behavior is normal. Judgment and thought content normal.  Head tilt positive to left.   Results for orders placed in visit on 09/23/13  GLUCOSE, POCT (MANUAL RESULT ENTRY)      Result Value Range   POC Glucose 146 (*) 70 - 99 mg/dl  POCT URINALYSIS DIPSTICK      Result Value Range   Color, UA yellow     Clarity, UA clear\     Glucose, UA neg     Bilirubin, UA neg     Ketones, UA neg     Spec Grav, UA 1.020     Blood, UA trace-lysed     pH, UA 5.5     Protein, UA neg     Urobilinogen, UA 0.2     Nitrite, UA neg     Leukocytes, UA Negative    POCT UA - MICROSCOPIC ONLY      Result Value Range   WBC, Ur, HPF, POC neg     RBC, urine, microscopic 0-2     Bacteria, U Microscopic neg     Mucus, UA trace     Epithelial cells, urine per micros neg     Crystals, Ur, HPF, POC neg     Casts, Ur, LPF, POC neg     Yeast, UA neg     Results for orders placed in visit on 09/23/13  GLUCOSE, POCT (MANUAL RESULT ENTRY)      Result Value Range   POC Glucose 146 (*) 70 - 99 mg/dl  POCT GLYCOSYLATED HEMOGLOBIN (HGB A1C)  Result Value Range   Hemoglobin A1C 6.5    POCT URINALYSIS DIPSTICK      Result Value Range   Color, UA yellow     Clarity, UA clear\     Glucose, UA neg     Bilirubin, UA neg     Ketones, UA neg     Spec Grav, UA 1.020     Blood, UA trace-lysed     pH, UA 5.5     Protein, UA neg     Urobilinogen, UA 0.2     Nitrite, UA neg     Leukocytes, UA Negative    POCT UA - MICROSCOPIC ONLY      Result Value Range   WBC, Ur, HPF, POC neg     RBC, urine, microscopic 0-2     Bacteria, U Microscopic neg     Mucus, UA trace     Epithelial cells, urine per micros neg     Crystals, Ur, HPF, POC neg     Casts, Ur, LPF, POC neg     Yeast, UA neg           Assessment & Plan:  Glucose intolerance controlled/BPV may need neuro w/up Hypothyroid stable RF meds 1 year

## 2013-09-25 ENCOUNTER — Encounter: Payer: Self-pay | Admitting: Family Medicine

## 2014-06-17 ENCOUNTER — Encounter: Payer: Self-pay | Admitting: Gastroenterology

## 2014-07-31 ENCOUNTER — Telehealth: Payer: Self-pay | Admitting: *Deleted

## 2014-07-31 NOTE — Telephone Encounter (Signed)
Called patient unable to leave message, recording states wireless customer unavailable.

## 2014-08-29 ENCOUNTER — Ambulatory Visit: Payer: PRIVATE HEALTH INSURANCE | Admitting: Family Medicine

## 2014-08-30 ENCOUNTER — Ambulatory Visit (INDEPENDENT_AMBULATORY_CARE_PROVIDER_SITE_OTHER): Payer: PRIVATE HEALTH INSURANCE | Admitting: Emergency Medicine

## 2014-08-30 VITALS — BP 132/80 | HR 74 | Temp 97.8°F | Resp 16 | Ht 70.75 in | Wt 269.0 lb

## 2014-08-30 DIAGNOSIS — E782 Mixed hyperlipidemia: Secondary | ICD-10-CM

## 2014-08-30 DIAGNOSIS — E119 Type 2 diabetes mellitus without complications: Secondary | ICD-10-CM

## 2014-08-30 LAB — POCT CBC
Granulocyte percent: 60.4 %G (ref 37–80)
HEMATOCRIT: 45.4 % (ref 43.5–53.7)
HEMOGLOBIN: 14.7 g/dL (ref 14.1–18.1)
LYMPH, POC: 3.4 (ref 0.6–3.4)
MCH: 27.9 pg (ref 27–31.2)
MCHC: 32.5 g/dL (ref 31.8–35.4)
MCV: 86.1 fL (ref 80–97)
MID (cbc): 0.6 (ref 0–0.9)
MPV: 6.9 fL (ref 0–99.8)
POC Granulocyte: 6.1 (ref 2–6.9)
POC LYMPH PERCENT: 33.5 %L (ref 10–50)
POC MID %: 6.1 %M (ref 0–12)
Platelet Count, POC: 255 10*3/uL (ref 142–424)
RBC: 5.27 M/uL (ref 4.69–6.13)
RDW, POC: 16.6 %
WBC: 10.1 10*3/uL (ref 4.6–10.2)

## 2014-08-30 LAB — POCT URINALYSIS DIPSTICK
BILIRUBIN UA: NEGATIVE
GLUCOSE UA: NEGATIVE
KETONES UA: NEGATIVE
LEUKOCYTES UA: NEGATIVE
Nitrite, UA: NEGATIVE
PH UA: 6
Protein, UA: NEGATIVE
RBC UA: NEGATIVE
Spec Grav, UA: 1.025
Urobilinogen, UA: 0.2

## 2014-08-30 LAB — COMPREHENSIVE METABOLIC PANEL
ALK PHOS: 80 U/L (ref 39–117)
ALT: 41 U/L (ref 0–53)
AST: 51 U/L — AB (ref 0–37)
Albumin: 4 g/dL (ref 3.5–5.2)
BILIRUBIN TOTAL: 0.8 mg/dL (ref 0.2–1.2)
BUN: 10 mg/dL (ref 6–23)
CO2: 27 mEq/L (ref 19–32)
CREATININE: 0.93 mg/dL (ref 0.50–1.35)
Calcium: 8.7 mg/dL (ref 8.4–10.5)
Chloride: 101 mEq/L (ref 96–112)
Glucose, Bld: 155 mg/dL — ABNORMAL HIGH (ref 70–99)
Potassium: 4.2 mEq/L (ref 3.5–5.3)
Sodium: 137 mEq/L (ref 135–145)
Total Protein: 7.2 g/dL (ref 6.0–8.3)

## 2014-08-30 LAB — LIPID PANEL
CHOL/HDL RATIO: 4.9 ratio
Cholesterol: 231 mg/dL — ABNORMAL HIGH (ref 0–200)
HDL: 47 mg/dL (ref >39)
LDL Cholesterol: 163 mg/dL — ABNORMAL HIGH (ref 0–99)
Triglycerides: 106 mg/dL (ref <150)
VLDL: 21 mg/dL (ref 0–40)

## 2014-08-30 LAB — POCT GLYCOSYLATED HEMOGLOBIN (HGB A1C): HEMOGLOBIN A1C: 8.1

## 2014-08-30 NOTE — Progress Notes (Signed)
[facility] [address] [place] [address] [phone]  Date:  08/30/2014   Name:  [name]   DOB:  01-22-1951   MRN:  [id number]  PCP:  Kennon Portela, MD    Chief Complaint: lab work   History of Present Illness:  [name] is a 63 y.o. very pleasant male patient who presents with the following:  For wellness examination.   Says has stopped his cholesterol medication as he wants to "beat it" on his own. Says is not a diabetic but reports an A1C of 8.6. Denies other complaint or health concern today.   Patient Active Problem List   Diagnosis Date Noted  . Pre-operative cardiovascular examination 06/16/2011  . Coronary artery disease-nonobstructive 50% lesions tandem and the LAD catheterization 2004 ejection fraction-50% 06/16/2011  . FATTY LIVER DISEASE 08/17/2010  . ARTHRITIS 08/17/2010  . FECAL OCCULT BLOOD 08/17/2010  . PANCREATITIS 08/13/2010  . HYPOTHYROIDISM 08/12/2010  . DIABETES MELLITUS 08/12/2010  . HYPERCHOLESTEROLEMIA 08/12/2010  . GERD 08/12/2010  . HIATAL HERNIA 08/12/2010  . HEMOCCULT POSITIVE STOOL 08/12/2010    Past Medical History  Diagnosis Date  . Coronary artery disease     tandem 50/60% lesions in the LAD a 20% lesion the RCA catheterization 2004  . Impaired hearing   . Tinnitus   . HLD (hyperlipidemia)   . Varicose veins   . Thyroid disease     not on meds  . Arthritis   . Degenerative disc disease   . Glucose intolerance (impaired glucose tolerance)     Past Surgical History  Procedure Laterality Date  . Shoulder surgery      both shoulders  . Knee surgery      both knees  . Mouth surgery      all teeth removed at age 62  . Repair peroneus tendon tear    . Joint replacement      History  Substance Use Topics  . Smoking status: Never Smoker   . Smokeless tobacco: Never Used  . Alcohol Use: No    Family History  Problem Relation Age of Onset  . Diabetes      siblings  . Heart  attack Brother     triple bypass  . Diabetes Brother   . Heart block Brother   . Diabetes Brother   . Heart defect      both parents  . Diabetes Mother   . Diabetes Father     No Known Allergies  Medication list has been reviewed and updated.  Current Outpatient Prescriptions on File Prior to Visit  Medication Sig Dispense Refill  . levothyroxine (SYNTHROID, LEVOTHROID) 50 MCG tablet Take 50 mcg by mouth 3 (three) times daily.      . metFORMIN (GLUCOPHAGE) 500 MG tablet Take 500 mg by mouth daily with breakfast.      . simvastatin (ZOCOR) 40 MG tablet Take 40 mg by mouth at bedtime.       No current facility-administered medications on file prior to visit.    Review of Systems:  As per HPI, otherwise negative.    Physical Examination: Filed Vitals:   08/30/14 0838  BP: 132/80  Pulse: 74  Temp: 97.8 F (36.6 C)  Resp: 16   Filed Vitals:   08/30/14 0838  Height: 5' 10.75" (1.797 m)  Weight: 269 lb (122.018 kg)   Body mass index is 37.79 kg/(m^2). Ideal Body Weight: Weight in (lb) to have BMI =  25: 177.6   GEN: overweight, NAD, Non-toxic, Alert & Oriented x 3 HEENT: Atraumatic, Normocephalic.  Ears and Nose: No external deformity. EXTR: No clubbing/cyanosis/edema NEURO: Normal gait.  PSYCH: Normally interactive. Conversant. Not depressed or anxious appearing.  Calm demeanor.  CHEST clear BS= COR:  RRR no murmur  Assessment and Plan: Wellness examination Labs pending   Signed,  Ellison Carwin, MD   Results for orders placed or performed in visit on 08/30/14  POCT glycosylated hemoglobin (Hb A1C)  Result Value Ref Range   Hemoglobin A1C 8.1   POCT CBC  Result Value Ref Range   WBC 10.1 4.6 - 10.2 K/uL   Lymph, poc 3.4 0.6 - 3.4   POC LYMPH PERCENT 33.5 10 - 50 %L   MID (cbc) 0.6 0 - 0.9   POC MID % 6.1 0 - 12 %M   POC Granulocyte 6.1 2 - 6.9   Granulocyte percent 60.4 37 - 80 %G   RBC 5.27 4.69 - 6.13 M/uL   Hemoglobin 14.7 14.1 - 18.1 g/dL    HCT, POC 45.4 43.5 - 53.7 %   MCV 86.1 80 - 97 fL   MCH, POC 27.9 27 - 31.2 pg   MCHC 32.5 31.8 - 35.4 g/dL   RDW, POC 16.6 %   Platelet Count, POC 255 142 - 424 K/uL   MPV 6.9 0 - 99.8 fL  POCT urinalysis dipstick  Result Value Ref Range   Color, UA yellow    Clarity, UA clear    Glucose, UA neg    Bilirubin, UA neg    Ketones, UA neg    Spec Grav, UA 1.025    Blood, UA neg    pH, UA 6.0    Protein, UA neg    Urobilinogen, UA 0.2    Nitrite, UA neg    Leukocytes, UA Negative

## 2014-08-30 NOTE — Patient Instructions (Signed)

## 2014-10-06 ENCOUNTER — Telehealth: Payer: Self-pay

## 2014-10-06 NOTE — Telephone Encounter (Signed)
Called to remind patient to get his flu shot, but a message came on saying patient was not available to take calls at this time.  There was no prompt to leave a message.

## 2015-06-01 ENCOUNTER — Encounter: Payer: Self-pay | Admitting: *Deleted

## 2016-06-29 ENCOUNTER — Other Ambulatory Visit (HOSPITAL_COMMUNITY): Payer: Self-pay | Admitting: Orthopedic Surgery

## 2016-06-29 ENCOUNTER — Ambulatory Visit (HOSPITAL_COMMUNITY)
Admission: RE | Admit: 2016-06-29 | Discharge: 2016-06-29 | Disposition: A | Discharge status: Home / Self Care | Payer: PRIVATE HEALTH INSURANCE | Source: Ambulatory Visit | Attending: Orthopedic Surgery | Admitting: Orthopedic Surgery

## 2016-06-29 DIAGNOSIS — Z01818 Encounter for other preprocedural examination: Secondary | ICD-10-CM | POA: Insufficient documentation

## 2016-06-29 DIAGNOSIS — M25511 Pain in right shoulder: Secondary | ICD-10-CM | POA: Insufficient documentation

## 2016-08-05 ENCOUNTER — Ambulatory Visit (INDEPENDENT_AMBULATORY_CARE_PROVIDER_SITE_OTHER): Payer: PRIVATE HEALTH INSURANCE | Admitting: Family Medicine

## 2016-08-05 VITALS — BP 126/76 | HR 74 | Temp 97.9°F | Resp 16 | Ht 71.0 in | Wt 246.0 lb

## 2016-08-05 DIAGNOSIS — I2510 Atherosclerotic heart disease of native coronary artery without angina pectoris: Secondary | ICD-10-CM | POA: Diagnosis not present

## 2016-08-05 DIAGNOSIS — Z01818 Encounter for other preprocedural examination: Secondary | ICD-10-CM | POA: Diagnosis not present

## 2016-08-05 NOTE — Patient Instructions (Signed)
Electrocardiography Electrocardiography is a test to check the heart. It looks at how your heart beats. It is done if:   You are having a heart attack.  You may have had a heart attack in the past.  You are having a health checkup. PROCEDURE  This test is easy and painless.  You will remove your clothes from the waist up, wear a hospital gown, and lie down on your back.  Small round pads (electrodes) will be placed on your chest, arms, and legs. The round pads are attached to wires that go to a machine. The machine records the electrical activity of your heart.  You will be asked to relax and lie very still. AFTER THE PROCEDURE  If the test was done as part of a routine exam, you can go back to your normal activity as told by your doctor.  Your results will be looked at by a heart doctor (cardiologist).  Ask when your test results will be ready. Make sure you get your test results.   This information is not intended to replace advice given to you by your health care provider. Make sure you discuss any questions you have with your health care provider.   Document Released: 09/22/2008 Document Revised: 10/31/2014 Document Reviewed: 02/20/2012 Elsevier Interactive Patient Education [employer].

## 2016-08-05 NOTE — Progress Notes (Signed)
  Chief Complaint  Patient presents with  . EKG    Pt. need by surgeon     HPI  Preop clearance for his right shoulder surgery He denies chest pain, shortness of breath or palpitations He is taking his medications as instructed  Past Medical History:  Diagnosis Date  . Arthritis   . Coronary artery disease    tandem 50/60% lesions in the LAD a 20% lesion the RCA catheterization 2004  . Degenerative disc disease   . Glucose intolerance (impaired glucose tolerance)   . HLD (hyperlipidemia)   . Impaired hearing   . Thyroid disease    not on meds  . Tinnitus   . Varicose veins     No current outpatient prescriptions on file.   No current facility-administered medications for this visit.     Allergies: No Known Allergies  Past Surgical History:  Procedure Laterality Date  . JOINT REPLACEMENT    . KNEE SURGERY     both knees  . MOUTH SURGERY     all teeth removed at age 30  . repair peroneus tendon tear    . SHOULDER SURGERY     both shoulders    Social History   Social History  . Marital status: Married    Spouse name: N/A  . Number of children: N/A  . Years of education: N/A   Social History Main Topics  . Smoking status: Never Smoker  . Smokeless tobacco: Never Used  . Alcohol use No  . Drug use: No  . Sexual activity: Not Asked   Other Topics Concern  . None   Social History Narrative  . None    ROS  Objective: Vitals:   08/05/16 0928  BP: 126/76  Pulse: 74  Resp: 16  Temp: 97.9 F (36.6 C)  TempSrc: Oral  SpO2: 99%  Weight: 246 lb (111.6 kg)  Height: 5\' 11"  (1.803 m)    Physical Exam  Constitutional: He appears well-developed and well-nourished.  HENT:  Head: Normocephalic and atraumatic.  Eyes: Conjunctivae and EOM are normal.  Cardiovascular: Normal rate, regular rhythm and normal heart sounds.   No murmur heard. Pulmonary/Chest: Effort normal and breath sounds normal. No respiratory distress.   EKG Independent  review NSR, no st changes or twi  Assessment and Plan [name] was seen today for ekg.  Diagnoses and all orders for this visit:  Pre-operative clearance- ecg without arrhythmia, no st changes Hold aspirin for surgery -     EKG 12-Lead  Coronary artery disease involving native coronary artery of native heart without angina pectoris-  Advised pt to hold aspirin for surgery      [name] [name]

## 2016-09-09 DIAGNOSIS — Z6833 Body mass index (BMI) 33.0-33.9, adult: Secondary | ICD-10-CM

## 2016-09-09 DIAGNOSIS — E6609 Other obesity due to excess calories: Secondary | ICD-10-CM | POA: Insufficient documentation

## 2017-02-28 ENCOUNTER — Encounter (HOSPITAL_COMMUNITY): Payer: Self-pay

## 2017-02-28 ENCOUNTER — Emergency Department (HOSPITAL_COMMUNITY)
Admission: EM | Admit: 2017-02-28 | Discharge: 2017-02-28 | Disposition: A | Discharge status: Home / Self Care | Payer: Worker's Compensation | Attending: Emergency Medicine | Admitting: Emergency Medicine

## 2017-02-28 ENCOUNTER — Ambulatory Visit: Payer: Self-pay | Admitting: Registered Nurse

## 2017-02-28 VITALS — BP 132/98 | HR 93 | Temp 98.7°F | Resp 18

## 2017-02-28 DIAGNOSIS — W268XXA Contact with other sharp object(s), not elsewhere classified, initial encounter: Secondary | ICD-10-CM | POA: Diagnosis not present

## 2017-02-28 DIAGNOSIS — I2510 Atherosclerotic heart disease of native coronary artery without angina pectoris: Secondary | ICD-10-CM | POA: Insufficient documentation

## 2017-02-28 DIAGNOSIS — Y929 Unspecified place or not applicable: Secondary | ICD-10-CM | POA: Insufficient documentation

## 2017-02-28 DIAGNOSIS — Z79899 Other long term (current) drug therapy: Secondary | ICD-10-CM | POA: Insufficient documentation

## 2017-02-28 DIAGNOSIS — Y999 Unspecified external cause status: Secondary | ICD-10-CM | POA: Insufficient documentation

## 2017-02-28 DIAGNOSIS — E119 Type 2 diabetes mellitus without complications: Secondary | ICD-10-CM | POA: Insufficient documentation

## 2017-02-28 DIAGNOSIS — S61210A Laceration without foreign body of right index finger without damage to nail, initial encounter: Secondary | ICD-10-CM

## 2017-02-28 DIAGNOSIS — Z7984 Long term (current) use of oral hypoglycemic drugs: Secondary | ICD-10-CM | POA: Diagnosis not present

## 2017-02-28 DIAGNOSIS — Y939 Activity, unspecified: Secondary | ICD-10-CM | POA: Insufficient documentation

## 2017-02-28 DIAGNOSIS — S6991XA Unspecified injury of right wrist, hand and finger(s), initial encounter: Secondary | ICD-10-CM | POA: Diagnosis present

## 2017-02-28 MED ORDER — LIDOCAINE HCL (PF) 1 % IJ SOLN
30.0000 mL | Freq: Once | INTRAMUSCULAR | Status: DC
  Filled 2017-02-28: qty 30

## 2017-02-28 MED ORDER — LIDOCAINE HCL (PF) 1 % IJ SOLN
INTRAMUSCULAR | Status: AC
  Filled 2017-02-28: qty 10

## 2017-02-28 NOTE — Progress Notes (Signed)
Subjective:    Patient ID: [name], male    DOB: 1951-08-15, 66 y.o.   MRN: [id number]  65y/o caucasian male presented to clinic with laceration to R distal index finger. Was using hedge trimmers at work [employer]) and "it turned on when it shouldn't have I wasn't pressing the safety switch" cutting his finger. Jagged laceration horizontally across anterior aspect of finger. Approx 1/4cm in depth. Bleeding controlled with manual pressure. Right hand dominant      Review of Systems  Constitutional: Negative for chills and fever.  HENT: Negative for congestion, ear pain and sore throat.   Eyes: Negative for pain and discharge.  Respiratory: Negative for cough and wheezing.   Cardiovascular: Negative for chest pain and leg swelling.  Gastrointestinal: Negative for nausea and vomiting.  Genitourinary: Negative for difficulty urinating, dysuria and hematuria.  Musculoskeletal: Positive for joint swelling and myalgias. Negative for arthralgias, back pain, gait problem, neck pain and neck stiffness.  Skin: Positive for color change and wound. Negative for pallor and rash.  Allergic/Immunologic: Negative for environmental allergies and food allergies.  Neurological: Negative for dizziness, tremors, seizures, syncope, facial asymmetry, speech difficulty, weakness, light-headedness, numbness and headaches.  Hematological: Negative for adenopathy. Does not bruise/bleed easily.  Psychiatric/Behavioral: Negative for agitation and confusion. The patient is not nervous/anxious.        Objective:   Physical Exam  Constitutional: He is oriented to person, place, and time. He appears well-developed and well-nourished. He is active and cooperative.  Non-toxic appearance. He does not have a sickly appearance. He does not appear ill. No distress.  HENT:  Head: Normocephalic and atraumatic.  Right Ear: Hearing and external ear normal.  Left Ear: Hearing and external ear  normal.  Nose: No mucosal edema, rhinorrhea, nose lacerations, sinus tenderness, nasal deformity, septal deviation or nasal septal hematoma. No epistaxis.  No foreign bodies. Right sinus exhibits no maxillary sinus tenderness and no frontal sinus tenderness. Left sinus exhibits no maxillary sinus tenderness and no frontal sinus tenderness.  Mouth/Throat: Uvula is midline, oropharynx is clear and moist and mucous membranes are normal. Mucous membranes are not pale, not dry and not cyanotic. He does not have dentures. No oral lesions. No trismus in the jaw. Normal dentition. No dental abscesses, uvula swelling, lacerations or dental caries. No oropharyngeal exudate, posterior oropharyngeal edema, posterior oropharyngeal erythema or tonsillar abscesses.  Bilateral allergic shiners  Eyes: Conjunctivae, EOM and lids are normal. Pupils are equal, round, and reactive to light. Right eye exhibits no chemosis, no discharge, no exudate and no hordeolum. No foreign body present in the right eye. Left eye exhibits no chemosis, no discharge, no exudate and no hordeolum. No foreign body present in the left eye. Right conjunctiva is not injected. Right conjunctiva has no hemorrhage. Left conjunctiva is not injected. Left conjunctiva has no hemorrhage. No scleral icterus. Right eye exhibits normal extraocular motion and no nystagmus. Left eye exhibits normal extraocular motion and no nystagmus. Right pupil is round and reactive. Left pupil is round and reactive. Pupils are equal.  Neck: Trachea normal, normal range of motion and phonation normal. Neck supple. No tracheal tenderness, no spinous process tenderness and no muscular tenderness present. No neck rigidity. No tracheal deviation, no edema, no erythema and normal range of motion present. No thyroid mass and no thyromegaly present.  Cardiovascular: Normal rate, regular rhythm, S1 normal, S2 normal, normal heart sounds and intact distal pulses.  PMI is not displaced.   Exam reveals  no gallop and no friction rub.   No murmur heard. Pulses:      Radial pulses are 2+ on the right side, and 2+ on the left side.  Patient with controlled bleeding right 2nd digit wrapped with handkerchief and elevated when walking into clinic; capillary refill less than 2 seconds; when pressure released mild serous oozing from 2cm laceration palmar 2nd digit PIP to distal phalanx jagged with ecchymosis  Pulmonary/Chest: Effort normal and breath sounds normal. No stridor. No respiratory distress. He has no decreased breath sounds. He has no wheezes. He has no rhonchi. He has no rales.  Abdominal: Soft. He exhibits no distension.  Musculoskeletal: Normal range of motion. He exhibits no edema.       Right shoulder: Normal.       Left shoulder: Normal.       Right elbow: Normal.      Left elbow: Normal.       Right wrist: Normal.       Left wrist: Normal.       Right hip: Normal.       Left hip: Normal.       Right knee: Normal.       Left knee: Normal.       Cervical back: Normal.       Right hand: He exhibits tenderness, laceration and swelling. He exhibits normal range of motion, normal capillary refill and no deformity. Normal sensation noted. Normal strength noted. He exhibits no finger abduction, no thumb/finger opposition and no wrist extension trouble.       Left hand: Normal.       Hands: Nonpitting 1-2+/4 nonpitting edema distal 2nd phalanx with ecchymosis and TTP mild right hand; full AROM states 1/10 pain  Lymphadenopathy:       Head (right side): No submental, no submandibular, no tonsillar, no preauricular, no posterior auricular and no occipital adenopathy present.       Head (left side): No submental, no submandibular, no tonsillar, no preauricular, no posterior auricular and no occipital adenopathy present.    He has no cervical adenopathy.       Right cervical: No superficial cervical, no deep cervical and no posterior cervical adenopathy present.      Left  cervical: No superficial cervical, no deep cervical and no posterior cervical adenopathy present.  Neurological: He is alert and oriented to person, place, and time. He has normal strength. He displays no atrophy and no tremor. No cranial nerve deficit or sensory deficit. He exhibits normal muscle tone. He displays no seizure activity. Coordination and gait normal. GCS eye subscore is 4. GCS verbal subscore is 5. GCS motor subscore is 6.  Bilateral hand grasp equal 5/5  Skin: Skin is warm and dry. Bruising, ecchymosis and laceration noted. No abrasion, no burn, no lesion, no petechiae and no rash noted. He is not diaphoretic. No cyanosis or erythema. No pallor. Nails show no clubbing.     Psychiatric: He has a normal mood and affect. His speech is normal and behavior is normal. Judgment and thought content normal. Cognition and memory are normal.  Nursing note and vitals reviewed.         Assessment & Plan:  A-initial encounter right 2nd distal phalanx laceration with nail damage foreign body status unknown  P-Wound cleansed with first aid antiseptic spray, wrapped with nonadherent telfa dressing, gauze and cobain for continued pressure. Ice pack placed and 800mg  Ibuprofen po x1 given for mild pain. Tdap out of date (last  2008 per care everywhere) recommended booster today. Sent to [facility] at [facility] for imaging rule out fracture/foreign body, suturing and Tdap administration with authorization form from [employer]. Being driven by coworker. Tylenol 1000mg  po QID prn pain given 4 unit doses from clinic stock.  Discussed cryotherapy 15 minutes TID prn swelling/pain has ice pack at home.  Given 6 UD triple antibiotic ointment to apply BID to laceration healed.  Avoid soaking in dirty water until healed e.g. Lakes/streams/chemicals/ponds.  Wash with soap and water twice a day.  Elevate right hand when sitting to reduce swelling and bleeding.  Keep pressure dressing on reinforce  if bleeds through.  Given extra cobain and telfa pads from clinic stock for use during POV transport.  Dressing was CDI upon discharge ambulatory from clinic and patient reported pain 1/10.  Exitcare handout on laceration and cellulitis given to patient.   Medications as directed. Monitor for signs and symptoms of infection hot/red/increased swelling/pain and/or purulent discharge.  Call or return to clinic as needed if these symptoms worsen or fail to improve as anticipated for re-evaluation.  Patient verbalized agreement and understanding of treatment plan and had no further questions at this time.  P2:  ROM, injury prevention

## 2017-02-28 NOTE — Patient Instructions (Addendum)
Keep hand elevated with pressure/ice during drive to [facility] with soap and water twice a day Apply topical antibiotic twice a day to affected area Do not soak in dirty water e.g. Pond/lake/river/chemicals until laceration fully healed Recommend tetanus booster  Tylenol 1000mg  by mouth every 6 hours as needed for pain Follow up with PCM as not taking any of his prescribed chronic condition medications Rx Nov 2017   [facility] Well labs fasting due July 2018 schedule with Cheri Guppy RN  Cellulitis, Adult Cellulitis is a skin infection. The infected area is usually red and tender. This condition occurs most often in the arms and lower legs. The infection can travel to the muscles, blood, and underlying tissue and become serious. It is very important to get treated for this condition. What are the causes? Cellulitis is caused by bacteria. The bacteria enter through a break in the skin, such as a cut, burn, insect bite, open sore, or crack. What increases the risk? This condition is more likely to occur in people who:  Have a weak defense system (immune system).  Have open wounds on the skin such as cuts, burns, bites, and scrapes. Bacteria can enter the body through these open wounds.  Are older.  Have diabetes.  Have a type of long-lasting (chronic) liver disease (cirrhosis) or kidney disease.  Use IV drugs. What are the signs or symptoms? Symptoms of this condition include:  Redness, streaking, or spotting on the skin.  Swollen area of the skin.  Tenderness or pain when an area of the skin is touched.  Warm skin.  Fever.  Chills.  Blisters. How is this diagnosed? This condition is diagnosed based on a medical history and physical exam. You may also have tests, including:  Blood tests.  Lab tests.  Imaging tests. How is this treated? Treatment for this condition may include:  Medicines, such as antibiotic medicines or antihistamines.  Supportive care,  such as rest and application of cold or warm cloths (cold or warm compresses) to the skin.  Hospital care, if the condition is severe. The infection usually gets better within 1-2 days of treatment. Follow these instructions at home:  Take over-the-counter and prescription medicines only as told by your health care provider.  If you were prescribed an antibiotic medicine, take it as told by your health care provider. Do not stop taking the antibiotic even if you start to feel better.  Drink enough fluid to keep your urine clear or pale yellow.  Do not touch or rub the infected area.  Raise (elevate) the infected area above the level of your heart while you are sitting or lying down.  Apply warm or cold compresses to the affected area as told by your health care provider.  Keep all follow-up visits as told by your health care provider. This is important. These visits let your health care provider make sure a more serious infection is not developing. Contact a health care provider if:  You have a fever.  Your symptoms do not improve within 1-2 days of starting treatment.  Your bone or joint underneath the infected area becomes painful after the skin has healed.  Your infection returns in the same area or another area.  You notice a swollen bump in the infected area.  You develop new symptoms.  You have a general ill feeling (malaise) with muscle aches and pains. Get help right away if:  Your symptoms get worse.  You feel very sleepy.  You  develop vomiting or diarrhea that persists.  You notice red streaks coming from the infected area.  Your red area gets larger or turns dark in color. This information is not intended to replace advice given to you by your health care provider. Make sure you discuss any questions you have with your health care provider. Document Released: 07/20/2005 Document Revised: 02/18/2016 Document Reviewed: 08/19/2015 Elsevier Interactive Patient  Education  2017 [place], Adult A laceration is a cut that goes through all of the layers of the skin and into the tissue that is right under the skin. Some lacerations heal on their own. Others need to be closed with stitches (sutures), staples, skin adhesive strips, or skin glue. Proper laceration care minimizes the risk of infection and helps the laceration to heal better. How is this treated? If sutures or staples were used:   Keep the wound clean and dry.  If you were given a bandage (dressing), you should change it at least one time per day or as told by your health care provider. You should also change it if it becomes wet or dirty.  Keep the wound completely dry for the first 24 hours or as told by your health care provider. After that time, you may shower or bathe. However, make sure that the wound is not soaked in water until after the sutures or staples have been removed.  Clean the wound one time each day or as told by your health care provider:  Wash the wound with soap and water.  Rinse the wound with water to remove all soap.  Pat the wound dry with a clean towel. Do not rub the wound.  After cleaning the wound, apply a thin layer of antibiotic ointmentas told by your health care provider. This will help to prevent infection and keep the dressing from sticking to the wound.  Have the sutures or staples removed as told by your health care provider. If skin adhesive strips were used:   Keep the wound clean and dry.  If you were given a bandage (dressing), you should change it at least one time per day or as told by your health care provider. You should also change it if it becomes dirty or wet.  Do not get the skin adhesive strips wet. You may shower or bathe, but be careful to keep the wound dry.  If the wound gets wet, pat it dry with a clean towel. Do not rub the wound.  Skin adhesive strips fall off on their own. You may trim the strips as the  wound heals. Do not remove skin adhesive strips that are still stuck to the wound. They will fall off in time. If skin glue was used:   Try to keep the wound dry, but you may briefly wet it in the shower or bath. Do not soak the wound in water, such as by swimming.  After you have showered or bathed, gently pat the wound dry with a clean towel. Do not rub the wound.  Do not do any activities that will make you sweat heavily until the skin glue has fallen off on its own.  Do not apply liquid, cream, or ointment medicine to the wound while the skin glue is in place. Using those may loosen the film before the wound has healed.  If you were given a bandage (dressing), you should change it at least one time per day or as told by your health care provider. You should  also change it if it becomes dirty or wet.  If a dressing is placed over the wound, be careful not to apply tape directly over the skin glue. Doing that may cause the glue to be pulled off before the wound has healed.  Do not pick at the glue. The skin glue usually remains in place for 5-10 days, then it falls off of the skin. General Instructions   Take over-the-counter and prescription medicines only as told by your health care provider.  If you were prescribed an antibiotic medicine or ointment, take or apply it as told by your doctor. Do not stop using it even if your condition improves.  To help prevent scarring, make sure to cover your wound with sunscreen whenever you are outside after stitches are removed, after adhesive strips are removed, or when glue remains in place and the wound is healed. Make sure to wear a sunscreen of at least 30 SPF.  Do not scratch or pick at the wound.  Keep all follow-up visits as told by your health care provider. This is important.  Check your wound every day for signs of infection. Watch for:  Redness, swelling, or pain.  Fluid, blood, or pus.  Raise (elevate) the injured area above  the level of your heart while you are sitting or lying down, if possible. Contact a health care provider if:  You received a tetanus shot and you have swelling, severe pain, redness, or bleeding at the injection site.  You have a fever.  A wound that was closed breaks open.  You notice a bad smell coming from your wound or your dressing.  You notice something coming out of the wound, such as wood or glass.  Your pain is not controlled with medicine.  You have increased redness, swelling, or pain at the site of your wound.  You have fluid, blood, or pus coming from your wound.  You notice a change in the color of your skin near your wound.  You need to change the dressing frequently due to fluid, blood, or pus draining from the wound.  You develop a new rash.  You develop numbness around the wound. Get help right away if:  You develop severe swelling around the wound.  Your pain suddenly increases and is severe.  You develop painful lumps near the wound or on skin that is anywhere on your body.  You have a red streak going away from your wound.  The wound is on your hand or foot and you cannot properly move a finger or toe.  The wound is on your hand or foot and you notice that your fingers or toes look pale or bluish. This information is not intended to replace advice given to you by your health care provider. Make sure you discuss any questions you have with your health care provider. Document Released: 10/10/2005 Document Revised: 03/11/2016 Document Reviewed: 10/06/2014 Elsevier Interactive Patient Education  2017 [employer].

## 2017-02-28 NOTE — ED Provider Notes (Signed)
[place] DEPT Provider Note   CSN: [id number] Arrival date & time: 02/28/17  1222  By signing my name below, I, [name], attest that this documentation has been prepared under the direction and in the presence of Jeff Manifold, MD. Electronically Signed: Sonum Freeman, Education administrator. 02/28/17. 1:36 PM.  History   Chief Complaint Chief Complaint  Patient presents with  . Finger Injury    The history is provided by the patient. No language interpreter was used.     HPI Comments: [name] is a 66 y.o. male who presents to the Emergency Department complaining of a laceration to the tip of the right index finger that occurred earlier today. Patient states this happened while using a hedge trimmer. He was seen by his PCP who advised he follow up in the ED. He denies numbness, paresthesia. His tetanus was updated today.   Past Medical History:  Diagnosis Date  . Arthritis   . Coronary artery disease    tandem 50/60% lesions in the LAD a 20% lesion the RCA catheterization 2004  . Degenerative disc disease   . Glucose intolerance (impaired glucose tolerance)   . HLD (hyperlipidemia)   . Impaired hearing   . Thyroid disease    not on meds  . Tinnitus   . Varicose veins     Patient Active Problem List   Diagnosis Date Noted  . Pre-operative cardiovascular examination 06/16/2011  . Coronary artery disease-nonobstructive 50% lesions tandem and the LAD catheterization 2004 ejection fraction-50% 06/16/2011  . FATTY LIVER DISEASE 08/17/2010  . ARTHRITIS 08/17/2010  . FECAL OCCULT BLOOD 08/17/2010  . PANCREATITIS 08/13/2010  . HYPOTHYROIDISM 08/12/2010  . DIABETES MELLITUS 08/12/2010  . HYPERCHOLESTEROLEMIA 08/12/2010  . GERD 08/12/2010  . HIATAL HERNIA 08/12/2010  . HEMOCCULT POSITIVE STOOL 08/12/2010    Past Surgical History:  Procedure Laterality Date  . JOINT REPLACEMENT    . KNEE SURGERY     both knees  . MOUTH SURGERY     all teeth removed at age 72  . repair  peroneus tendon tear    . SHOULDER SURGERY     both shoulders       Home Medications    Prior to Admission medications   Medication Sig Start Date End Date Taking? Authorizing Provider  Blood Glucose Monitoring Suppl (FIFTY50 GLUCOSE METER 2.0) w/Device KIT Please check blood sugars once daily due to fluctuating blood sugars. 09/09/16   [provider]  levothyroxine (SYNTHROID, LEVOTHROID) 50 MCG tablet Take by mouth. 09/09/16   [provider]  metFORMIN (GLUCOPHAGE-XR) 500 MG 24 hr tablet Take 500 mg by mouth. 09/09/16   [provider]  simvastatin (ZOCOR) 5 MG tablet Take 5 mg by mouth. 09/09/16   [provider]    Family History Family History  Problem Relation Age of Onset  . Heart attack Brother     triple bypass  . Diabetes Brother   . Heart block Brother   . Diabetes Brother   . Diabetes Mother   . Diabetes Father   . Diabetes      siblings  . Heart defect      both parents    Social History Social History  Substance Use Topics  . Smoking status: Never Smoker  . Smokeless tobacco: Never Used  . Alcohol use No     Allergies   Patient has no known allergies.   Review of Systems Review of Systems  Skin: Positive for wound.  Neurological: Negative for weakness  and numbness.     Physical Exam Updated Vital Signs BP (!) 144/104   Pulse 75   Temp 98 F (36.7 C) (Oral)   Resp 18   Ht _0  (1.803 m)   Wt 250 lb (113.4 kg)   SpO2 99%   BMI 34.87 kg/m   Physical Exam  Constitutional: He is oriented to person, place, and time. He appears well-developed and well-nourished.  HENT:  Head: Normocephalic.  Eyes: EOM are normal.  Neck: Normal range of motion.  Pulmonary/Chest: Effort normal.  Abdominal: He exhibits no distension.  Musculoskeletal: Normal range of motion.  Stellate laceration to finger tip of right index finger, palmar aspect. 3 cm in total length. No bleeding. Sensation intact to both sides of  finger distal to wound. Good cap refill. Able to flex against resistance.   Neurological: He is alert and oriented to person, place, and time.  Skin: Laceration noted.  Psychiatric: He has a normal mood and affect.  Nursing note and vitals reviewed.    ED Treatments / Results  DIAGNOSTIC STUDIES: Oxygen Saturation is 99% on RA, normal by my interpretation.    COORDINATION OF CARE: 1:32 PM Discussed treatment plan with pt at bedside and pt agreed to plan.   Labs (all labs ordered are listed, but only abnormal results are displayed) Labs Reviewed - No data to display  EKG  EKG Interpretation None       Radiology No results found.  Procedures .Nerve Block Date/Time: 02/28/2017 1:33 PM Performed by: [name] Authorized by: [name]   Consent:    Consent obtained:  Verbal   Consent given by:  Patient Indications:    Indications:  Procedural anesthesia Location:    Body area:  Upper extremity   Upper extremity nerve:  Metacarpal   Laterality:  Right Pre-procedure details:    Skin preparation:  Alcohol Skin anesthesia (see MAR for exact dosages):    Skin anesthesia method:  None Procedure details (see MAR for exact dosages):    Block needle gauge:  25 G   Anesthetic injected:  Lidocaine 1% w/o epi (3 cc)   Steroid injected:  None   Additive injected:  None   Injection procedure:  Anatomic landmarks palpated and introduced needle   Paresthesia:  None Post-procedure details:    Outcome:  Anesthesia achieved   Patient tolerance of procedure:  Tolerated well, no immediate complications .Marland KitchenLaceration Repair Date/Time: 02/28/2017 1:42 PM Performed by: [name] Authorized by: [name]   Consent:    Consent obtained:  Verbal   Consent given by:  Patient Anesthesia (see MAR for exact dosages):    Anesthesia method:  Nerve block Laceration details:    Location:  Finger   Finger location:  R index finger   Length (cm):  3 Repair type:     Repair type:  Simple Pre-procedure details:    Preparation:  Patient was prepped and draped in usual sterile fashion Exploration:    Wound exploration: wound explored through full range of motion   Treatment:    Amount of cleaning:  Standard   Visualized foreign bodies/material removed: no   Skin repair:    Repair method:  Sutures   Suture size:  4-0   Wound skin closure material used: mono.   Suture technique:  Simple interrupted   Number of sutures:  9 Post-procedure details:    Patient tolerance of procedure:  Tolerated well, no immediate complications   (including critical care time)  Medications Ordered  in ED Medications  lidocaine (PF) (XYLOCAINE) 1 % injection 30 mL (not administered)  lidocaine (PF) (XYLOCAINE) 1 % injection (not administered)     Initial Impression / Assessment and Plan / ED Course  I have reviewed the triage vital signs and the nursing notes.  Pertinent labs & imaging results that were available during my care of the patient were reviewed by me and considered in my medical decision making (see chart for details).     65yM with multiple small lacs to fingers caused by hedge clipper. NVI. Closed. Doubt fx. Continued wound care and return precautions discussed.   Final Clinical Impressions(s) / ED Diagnoses   Final diagnoses:  Laceration of right index finger without foreign body without damage to nail, initial encounter    New Prescriptions New Prescriptions   No medications on file   I personally preformed the services scribed in my presence. The recorded information has been reviewed is accurate. Jeff Manifold, MD.    Jeff Manifold, MD 03/06/17 [phone]

## 2017-02-28 NOTE — ED Notes (Signed)
Pt had DT at PCP office today.

## 2017-02-28 NOTE — ED Triage Notes (Signed)
Per Pt, Pt is coming from PCP where he was noted to have cut his right index finger with a hedge trimmer. Noted to be deep and in need of stitches. Bleeding controlled

## 2017-07-31 DIAGNOSIS — E039 Hypothyroidism, unspecified: Secondary | ICD-10-CM | POA: Diagnosis not present

## 2017-07-31 DIAGNOSIS — H8113 Benign paroxysmal vertigo, bilateral: Secondary | ICD-10-CM | POA: Diagnosis not present

## 2017-07-31 DIAGNOSIS — E1165 Type 2 diabetes mellitus with hyperglycemia: Secondary | ICD-10-CM | POA: Diagnosis not present

## 2017-08-09 ENCOUNTER — Ambulatory Visit (HOSPITAL_COMMUNITY): Payer: Medicare Other | Attending: Family Medicine | Admitting: Physical Therapy

## 2017-08-09 DIAGNOSIS — H8113 Benign paroxysmal vertigo, bilateral: Secondary | ICD-10-CM

## 2017-08-09 NOTE — Therapy (Addendum)
[place], [place], [address] Phone: [phone]   Fax:  [phone]  Physical Therapy Evaluation  Patient Details  Name: [name] MRN: [id number] Date of Birth: 1951/02/19 Referring Provider: Terrill Mohr  Encounter Date: 08/09/2017      PT End of Session - 08/09/17 1211    Visit Number 1   Number of Visits 8   Date for PT Re-Evaluation 09/08/17   Authorization Type medicare   Authorization - Visit Number 1   Authorization - Number of Visits 8   PT Start Time 1100   PT Stop Time 1210   PT Time Calculation (min) 70 min   Activity Tolerance Patient tolerated treatment well   Behavior During Therapy [facility] for tasks assessed/performed      Past Medical History:  Diagnosis Date  . Arthritis   . Coronary artery disease    tandem 50/60% lesions in the LAD a 20% lesion the RCA catheterization 2004  . Degenerative disc disease   . Glucose intolerance (impaired glucose tolerance)   . HLD (hyperlipidemia)   . Impaired hearing   . Thyroid disease    not on meds  . Tinnitus   . Varicose veins     Past Surgical History:  Procedure Laterality Date  . JOINT REPLACEMENT    . KNEE SURGERY     both knees  . MOUTH SURGERY     all teeth removed at age 89  . repair peroneus tendon tear    . SHOULDER SURGERY     both shoulders    There were no vitals filed for this visit.       Subjective Assessment - 08/09/17 1106    Subjective [name] states that he has been having periodic bouts of dizziness when he turns his head or turns over in bed.  It is worse to the left vs. the right.   This has been going on for the past two months.  He was given medication and sent to therapy.  He states that this occured four years ago taking a month or better to go away.     Pertinent History CAD , tinnitus    Currently in Pain? No/denies            [facility] PT Assessment - 08/09/17 0001      Assessment   Medical  Diagnosis BPPV   Referring Provider Terrill Mohr   Onset Date/Surgical Date 06/09/17   Next MD Visit not scheduled   Prior Therapy none     Precautions   Precautions None     Restrictions   Weight Bearing Restrictions Yes     Balance Screen   Has the patient fallen in the past 6 months No   Has the patient had a decrease in activity level because of a fear of falling?  No   Is the patient reluctant to leave their home because of a fear of falling?  No     Home Ecologist residence     Prior Function   Vocation Part time employment   Vocation Requirements maintenance      Cognition   Overall Cognitive Status Within Functional Limits for tasks assessed     Observation/Other Assessments   Focus on Therapeutic Outcomes (FOTO)  81     Functional Tests   Functional tests Single leg stance     Single Leg Stance   Comments Rt 2 seconds; Lt  6      Posture/Postural Control   Posture/Postural Control Postural limitations   Postural Limitations Rounded Shoulders;Forward head   Posture Comments Tight in upper trap mm             Vestibular Assessment - 08/09/17 0001      Vestibular Assessment   General Observation normal      Symptom Behavior   Type of Dizziness "World moves"   Frequency of Dizziness 3-4 times a day    Duration of Dizziness 30 seconds   Aggravating Factors Turning head quickly;Rolling to left;Rolling to right   Relieving Factors Head stationary;Medication     Occulomotor Exam   Occulomotor Alignment Normal   Spontaneous Absent   Gaze-induced Absent   Head shaking Horizontal Absent   Smooth Pursuits Saccades   Saccades Poor trajectory     Vestibulo-Occular Reflex   VOR 1 Head Only (x 1 viewing) negative    VOR 2 Head and Object (x 2 viewing) negative      Positional Testing   Dix-Hallpike Dix-Hallpike Right;Dix-Hallpike Left     Dix-Hallpike Right   Dix-Hallpike Right Duration 10 seconds    Dix-Hallpike  Right Symptoms Upbeat, right rotatory nystagmus  mild      Dix-Hallpike Left   Dix-Hallpike Left Duration 5 seconds    Dix-Hallpike Left Symptoms Upbeat, right rotatory nystagmus     Cognition   Cognition Orientation Level Appropriate for developmental age     Positional Sensitivities   Up from Right Hallpike Mild dizziness   Up from Left Hallpike Severe dizziness   Rolling Left Severe dizziness        Objective measurements completed on examination: See above findings.           Vestibular Treatment/Exercise - 08/09/17 0001      Vestibular Treatment/Exercise   Vestibular Treatment Provided Canalith Repositioning;Gaze   Canalith Repositioning Epley Manuever Left   Gaze Exercises Eye/Head Exercise Horizontal;Eye/Head Exercise Vertical      EPLEY MANUEVER LEFT   Number of Reps  2   Overall Response  Improved Symptoms     Eye/Head Exercise Horizontal   Foot Position floor   Reps 5     Eye/Head Exercise Vertical   Foot Position floor   Reps 5               PT Education - 08/09/17 1212    Education provided Yes   Education Details What BPPV is and how Eply manuever helps    Person(s) Educated Patient   Methods Explanation   Comprehension Verbalized understanding          PT Short Term Goals - 08/09/17 1242      PT SHORT TERM GOAL #1   Title Pt to state that he is only experiencing sx of vertigo 2 x a day    Time 2   Period Weeks   Status New   Target Date 08/23/17     PT SHORT TERM GOAL #2   Title Pt to be able to single leg stance on both Le for at least 12 seconds to reduce risk of falling    Time 2   Period Weeks   Status New           PT Long Term Goals - 08/09/17 1243      PT LONG TERM GOAL #1   Title Pt to state that he has not had any sx of vertigo in the past week    Time 4  Period Weeks   Status New   Target Date 09/06/17     PT LONG TERM GOAL #2   Title Pt to be able to single leg stance for 20 seconds on both LE to  decrease risk of falling    Time 4   Period Weeks   Status New     PT LONG TERM GOAL #3   Title Pt to be able to verbalize self manuevers to decrease symptoms if they return    Time 4   Period Weeks   Status New                Plan - 08/09/17 1212    Clinical Impression Statement [name] is a 66 yo male who has had dizziness with turning his head or rolling to the left for the past 2 months. The symptoms occur approximately four times a day and are of very short duration.  He has currently been referred to skilled physical therapy for vestibular treatment.  Mr. Radoncic had a positive Hal-Pike Dix manuever both to the left and the right side as well as decreased balance and tight trapezius mm.  Mr. Griess will benefit from skilled physical therapy to address these issues and maximize his safety.    History and Personal Factors relevant to plan of care: tinnitus, DM, CAD   Clinical Presentation Stable   Clinical Decision Making Moderate   Rehab Potential Good   PT Frequency 2x / week   PT Duration 4 weeks   PT Treatment/Interventions ADLs/Self Care Home Management;Patient/family education;Manual techniques;Therapeutic exercise;Balance training;Neuromuscular re-education   PT Next Visit Plan Repeat Eply if positive Hal-Pike Dix; begin balance activity as well as manual for cervical area to improve ROM and decrease trap spasm.    Consulted and Agree with Plan of Care Patient       [phone] CJ [phone] CI Patient will benefit from skilled therapeutic intervention in order to improve the following deficits and impairments:  Dizziness, Decreased balance, Decreased range of motion  Visit Diagnosis: BPPV (benign paroxysmal positional vertigo), bilateral     Problem List Patient Active Problem List   Diagnosis Date Noted  . Class 1 obesity due to excess calories with body mass index (BMI) of 33.0 to 33.9 in adult 09/09/2016  . Pre-operative cardiovascular examination 06/16/2011  .  Coronary artery disease-nonobstructive 50% lesions tandem and the LAD catheterization 2004 ejection fraction-50% 06/16/2011  . FATTY LIVER DISEASE 08/17/2010  . ARTHRITIS 08/17/2010  . FECAL OCCULT BLOOD 08/17/2010  . PANCREATITIS 08/13/2010  . HYPOTHYROIDISM 08/12/2010  . Diabetes mellitus ([place]) 08/12/2010  . HYPERCHOLESTEROLEMIA 08/12/2010  . GERD 08/12/2010  . HIATAL HERNIA 08/12/2010  . HEMOCCULT POSITIVE STOOL 08/12/2010    Rayetta Humphrey, PT CLT [phone] 08/09/2017, 12:48 PM  [facility] [address] [place], [place], [address] Phone: [phone]   Fax:  [phone]  Name: [name] MRN: [id number] Date of Birth: 1951/05/29

## 2017-08-09 NOTE — Patient Instructions (Addendum)
Gaze Stabilization: Tip Card  1.Target must remain in focus, not blurry, and appear stationary while head is in motion. 2.Perform exercises with small head movements (45 to either side of midline). 3.Increase speed of head motion so long as target is in focus. 4.If you wear eyeglasses, be sure you can see target through lens (therapist will give specific instructions for bifocal / progressive lenses). 5.These exercises may provoke dizziness or nausea. Work through these symptoms. If too dizzy, slow head movement slightly. Rest between each exercise. 6.Exercises demand concentration; avoid distractions. 7.For safety, perform standing exercises close to a counter, wall, corner, or next to someone.  Copyright  VHI. All rights reserved.  Movements: Eyes Only (Pictorial Reference)    Therapist: Use this card with Eye Exercises 14 through 17.   Copyright  VHI. All rights reserved.  AROM: Neck Rotation    Turn head slowly to look over one shoulder, then the other. Hold each position _5___ seconds. Repeat _5-10___ times per set. Do _1___ sets per session. Do __4__ sessions per day.  http://orth.exer.us/294   Copyright  VHI. All rights reserved.

## 2017-08-16 ENCOUNTER — Encounter (HOSPITAL_COMMUNITY): Payer: Self-pay

## 2017-08-16 ENCOUNTER — Ambulatory Visit (HOSPITAL_COMMUNITY): Payer: Medicare Other

## 2017-08-16 DIAGNOSIS — H8113 Benign paroxysmal vertigo, bilateral: Secondary | ICD-10-CM

## 2017-08-16 NOTE — Therapy (Signed)
[facility] [address] [place], [place], [address] Phone: [phone]   Fax:  [phone]  Physical Therapy Treatment  Patient Details  Name: [name] MRN: [id number] Date of Birth: 20-May-1951 Referring Provider: Terrill Mohr  Encounter Date: 08/16/2017      PT End of Session - 08/16/17 0907    Visit Number 2   Number of Visits 8   Date for PT Re-Evaluation 09/08/17   Authorization Type medicare   Authorization - Visit Number 2   Authorization - Number of Visits 8   PT Start Time 0900   PT Stop Time 0944   PT Time Calculation (min) 44 min   Equipment Utilized During Treatment Gait belt   Activity Tolerance Patient tolerated treatment well   Behavior During Therapy [facility] for tasks assessed/performed      Past Medical History:  Diagnosis Date  . Arthritis   . Coronary artery disease    tandem 50/60% lesions in the LAD a 20% lesion the RCA catheterization 2004  . Degenerative disc disease   . Glucose intolerance (impaired glucose tolerance)   . HLD (hyperlipidemia)   . Impaired hearing   . Thyroid disease    not on meds  . Tinnitus   . Varicose veins     Past Surgical History:  Procedure Laterality Date  . JOINT REPLACEMENT    . KNEE SURGERY     both knees  . MOUTH SURGERY     all teeth removed at age 63  . repair peroneus tendon tear    . SHOULDER SURGERY     both shoulders    There were no vitals filed for this visit.      Subjective Assessment - 08/16/17 0905    Subjective Pt stated he continues to have dizziness, no reports of recent falls of LOB.  Has began HEP without questions.     Pertinent History CAD , tinnitus    Currently in Pain? No/denies                Vestibular Assessment - 08/16/17 0001      Occulomotor Exam   Smooth Pursuits Saccades     Positional Testing   Dix-Hallpike Dix-Hallpike Right     Dix-Hallpike Right   Dix-Hallpike Right Duration 10 seconds    Dix-Hallpike Right Symptoms Upbeat, right rotatory nystagmus     Dix-Hallpike Left   Dix-Hallpike Left Duration 5 seconds    Dix-Hallpike Left Symptoms Upbeat, right rotatory nystagmus     Cognition   Cognition Orientation Level Appropriate for developmental age     Positional Sensitivities   Up from Right Hallpike Mild dizziness   Up from Left Hallpike Lightheadedness   Rolling Right Lightheadedness   Rolling Left Mild dizziness                 [facility] Adult PT Treatment/Exercise - 08/16/17 0001      Manual Therapy   Manual Therapy Soft tissue mobilization   Manual therapy comments Manual separate than rest of tx   Soft tissue mobilization cervical mm in supine position         Vestibular Treatment/Exercise - 08/16/17 0001      Vestibular Treatment/Exercise   Vestibular Treatment Provided Canalith Repositioning;Gaze   Canalith Repositioning Epley Manuever Left   Gaze Exercises Eye/Head Exercise Horizontal;Eye/Head Exercise Vertical      EPLEY MANUEVER LEFT   Number of Reps  3   Overall Response  Improved Symptoms  RESPONSE DETAILS LEFT reports dizziness only with transitions     Eye/Head Exercise Horizontal   Foot Position partial tandem stance   Reps 5     Eye/Head Exercise Vertical   Foot Position partial tandem stance   Reps 5            Balance Exercises - 08/16/17 1306      Balance Exercises: Standing   Standing Eyes Opened Narrow base of support (BOS)  eye/head movements NBOS   Tandem Stance 5 reps  with eye then head movements           PT Education - 08/16/17 1716    Education provided Yes   Education Details Reviewed goals, purpose of Eply manuever, complaince iwth HEP, copy of eval given to pt.     Person(s) Educated Patient   Methods Explanation;Demonstration;Handout   Comprehension Verbalized understanding;Returned demonstration          PT Short Term Goals - 08/09/17 1242      PT SHORT TERM GOAL #1   Title Pt to  state that he is only experiencing sx of vertigo 2 x a day    Time 2   Period Weeks   Status New   Target Date 08/23/17     PT SHORT TERM GOAL #2   Title Pt to be able to single leg stance on both Le for at least 12 seconds to reduce risk of falling    Time 2   Period Weeks   Status New           PT Long Term Goals - 08/09/17 1243      PT LONG TERM GOAL #1   Title Pt to state that he has not had any sx of vertigo in the past week    Time 4   Period Weeks   Status New   Target Date 09/06/17     PT LONG TERM GOAL #2   Title Pt to be able to single leg stance for 20 seconds on both LE to decrease risk of falling    Time 4   Period Weeks   Status New     PT LONG TERM GOAL #3   Title Pt to be able to verbalize self manuevers to decrease symptoms if they return    Time 4   Period Weeks   Status New               Plan - 08/16/17 1257    Clinical Impression Statement Reviewed goals, assured compliance with HEP and copy of eval given to pt.  Pt was positive with Hal-pike dix manuever Rt sided only this session, reports decrease in dizziness following manual Eply's manuever and EOS only dizzy during transitions of movements.  Manual soft tissue mobilizaiton complete to reduce tightness and improve cervical mobility following.  EOS with balance activities with eye and head movements in NBOS/partial tandem stance.  No reports of dizziness during balance activities, did required min A for safety wtih LOB.     Rehab Potential Good   PT Frequency 2x / week   PT Duration 4 weeks   PT Treatment/Interventions ADLs/Self Care Home Management;Patient/family education;Manual techniques;Therapeutic exercise;Balance training;Neuromuscular re-education   PT Next Visit Plan Repeat Eply if positive Hal-Pike Dix; begin balance activity as well as manual for cervical area to improve ROM and decrease trap spasm.       Patient will benefit from skilled therapeutic intervention in order to  improve the following deficits  and impairments:  Dizziness, Decreased balance, Decreased range of motion  Visit Diagnosis: BPPV (benign paroxysmal positional vertigo), bilateral     Problem List Patient Active Problem List   Diagnosis Date Noted  . Class 1 obesity due to excess calories with body mass index (BMI) of 33.0 to 33.9 in adult 09/09/2016  . Pre-operative cardiovascular examination 06/16/2011  . Coronary artery disease-nonobstructive 50% lesions tandem and the LAD catheterization 2004 ejection fraction-50% 06/16/2011  . FATTY LIVER DISEASE 08/17/2010  . ARTHRITIS 08/17/2010  . FECAL OCCULT BLOOD 08/17/2010  . PANCREATITIS 08/13/2010  . HYPOTHYROIDISM 08/12/2010  . Diabetes mellitus ([place]) 08/12/2010  . HYPERCHOLESTEROLEMIA 08/12/2010  . GERD 08/12/2010  . HIATAL HERNIA 08/12/2010  . HEMOCCULT POSITIVE STOOL 08/12/2010   Ihor Austin, [place] [facility] [place]  Aldona Lento 08/16/2017, 5:17 PM  [place], [place], [address] Phone: [phone]   Fax:  [phone]  Name: LAWTON DOLLINGER MRN: [id number] Date of Birth: Jul 11, 1951

## 2017-08-18 ENCOUNTER — Ambulatory Visit (HOSPITAL_COMMUNITY): Payer: Medicare Other | Admitting: Physical Therapy

## 2017-08-18 DIAGNOSIS — H8113 Benign paroxysmal vertigo, bilateral: Secondary | ICD-10-CM | POA: Diagnosis not present

## 2017-08-18 NOTE — Therapy (Signed)
[facility] [address] [place], [place], [address] Phone: [phone]   Fax:  [phone]  Physical Therapy Treatment  Patient Details  Name: [name] MRN: [id number] Date of Birth: 03-08-1951 Referring Provider: Terrill Mohr  Encounter Date: 08/18/2017      PT End of Session - 08/18/17 0940    Visit Number 3   Number of Visits 8   Date for PT Re-Evaluation 09/08/17   Authorization Type medicare   Authorization - Visit Number 3   Authorization - Number of Visits 8   PT Start Time 0850   PT Stop Time 0939   PT Time Calculation (min) 49 min   Equipment Utilized During Treatment Gait belt   Activity Tolerance Patient tolerated treatment well   Behavior During Therapy [facility] for tasks assessed/performed      Past Medical History:  Diagnosis Date  . Arthritis   . Coronary artery disease    tandem 50/60% lesions in the LAD a 20% lesion the RCA catheterization 2004  . Degenerative disc disease   . Glucose intolerance (impaired glucose tolerance)   . HLD (hyperlipidemia)   . Impaired hearing   . Thyroid disease    not on meds  . Tinnitus   . Varicose veins     Past Surgical History:  Procedure Laterality Date  . JOINT REPLACEMENT    . KNEE SURGERY     both knees  . MOUTH SURGERY     all teeth removed at age 30  . repair peroneus tendon tear    . SHOULDER SURGERY     both shoulders    There were no vitals filed for this visit.      Subjective Assessment - 08/18/17 0850    Subjective PT continues to have dizziness but has noticed that he is able to turn his head better now.     Pertinent History CAD , tinnitus           scapular retraction x s10 Back extension x 10      Vestibular Assessment - 08/18/17 0001      Dix-Hallpike Right   Dix-Hallpike Right Symptoms No nystagmus     Dix-Hallpike Left   Dix-Hallpike Left Symptoms No nystagmus     Positional Sensitivities   Up from Right Hallpike  Lightheadedness   Up from Left Hallpike Severe dizziness                  Vestibular Treatment/Exercise - 08/18/17 0001      Vestibular Treatment/Exercise   Habituation Exercises Comment  Lt sidelying to sitting.  x 5       EPLEY MANUEVER LEFT   Number of Reps  1   Overall Response  Improved Symptoms            Balance Exercises - 08/18/17 0931      Balance Exercises: Standing   Standing Eyes Closed Narrow base of support (BOS);Head turns;5 reps  Both rt to left and up and down   Tandem Stance Foam/compliant surface;2 reps             PT Short Term Goals - 08/09/17 1242      PT SHORT TERM GOAL #1   Title Pt to state that he is only experiencing sx of vertigo 2 x a day    Time 2   Period Weeks   Status New   Target Date 08/23/17     PT SHORT TERM GOAL #2  Title Pt to be able to single leg stance on both Le for at least 12 seconds to reduce risk of falling    Time 2   Period Weeks   Status New           PT Long Term Goals - 08/09/17 1243      PT LONG TERM GOAL #1   Title Pt to state that he has not had any sx of vertigo in the past week    Time 4   Period Weeks   Status New   Target Date 09/06/17     PT LONG TERM GOAL #2   Title Pt to be able to single leg stance for 20 seconds on both LE to decrease risk of falling    Time 4   Period Weeks   Status New     PT LONG TERM GOAL #3   Title Pt to be able to verbalize self manuevers to decrease symptoms if they return    Time 4   Period Weeks   Status New               Plan - 08/18/17 0940    Clinical Impression Statement PT stands and walks with noted forward bend explained the importance of posture in balance.  Pt given new exercise program to address posture as well as habituation.  Noted RT foot inversion with balance activities.  PT states that he has had difficulty with this ever since he had a foot injury.  Orthotic referral sent to MD>   Rehab Potential Good   PT  Frequency 2x / week   PT Duration 4 weeks   PT Treatment/Interventions ADLs/Self Care Home Management;Patient/family education;Manual techniques;Therapeutic exercise;Balance training;Neuromuscular re-education   PT Next Visit Plan Repeat Eply if positive Hal-Pike Dix try to the Right ;continue higher  balance activity as well as manual for cervical area to improve ROM and decrease trap spasm.       Patient will benefit from skilled therapeutic intervention in order to improve the following deficits and impairments:  Dizziness, Decreased balance, Decreased range of motion  Visit Diagnosis: BPPV (benign paroxysmal positional vertigo), bilateral     Problem List Patient Active Problem List   Diagnosis Date Noted  . Class 1 obesity due to excess calories with body mass index (BMI) of 33.0 to 33.9 in adult 09/09/2016  . Pre-operative cardiovascular examination 06/16/2011  . Coronary artery disease-nonobstructive 50% lesions tandem and the LAD catheterization 2004 ejection fraction-50% 06/16/2011  . FATTY LIVER DISEASE 08/17/2010  . ARTHRITIS 08/17/2010  . FECAL OCCULT BLOOD 08/17/2010  . PANCREATITIS 08/13/2010  . HYPOTHYROIDISM 08/12/2010  . Diabetes mellitus ([place]) 08/12/2010  . HYPERCHOLESTEROLEMIA 08/12/2010  . GERD 08/12/2010  . HIATAL HERNIA 08/12/2010  . HEMOCCULT POSITIVE STOOL 08/12/2010   Rayetta Humphrey, PT CLT [phone] 08/18/2017, 9:43 AM  [facility] [address] [place], [place], [address] Phone: [phone]   Fax:  [phone]  Name: [name] MRN: [id number] Date of Birth: 07-Jun-1951

## 2017-08-18 NOTE — Patient Instructions (Addendum)
Scapular Retraction (Standing)    With arms at sides, pinch shoulder blades together. Repeat ___10_ times per set. Do ___1_ sets per session. Do 3____ sessions per day.  http://orth.exer.us/944   Copyright  VHI. All rights reserved.  Backward Bend (Standing)    Arch backward to make hollow of back deeper. Hold ___3-5_ seconds. Repeat __10__ times per set. Do ___1_ sets per session. Do __3__ sessions per day.  http://orth.exer.us/178   Copyright  VHI. All rights reserved.  Rolling    With pillow under head, start on back. Roll slowly to right . Hold position until symptoms subside. Roll slowly onto left side. Hold position until symptoms subside. Repeat sequence __5-10__ times per session. Do ___4_ sessions per day.  Copyright  VHI. All rights reserved.  Self Treatment for Left Posterior / Anterior Canalithiasis    Sitting on bed: 1. Turn head 45 left. (a) Lie back slowly, shoulders on pillow, head on bed. (b) Hold _30___ seconds. 2. Keeping head on bed, turn head 90 right. Hold __30__ seconds. 3. Roll to right, head on 45 angle down toward bed. Hold _30___ seconds. 4. Sit up on right side of bed. Repeat __2__ times per session. Do _1___ sessions per day.  Copyright  VHI. All rights reserved.

## 2017-08-23 ENCOUNTER — Ambulatory Visit (HOSPITAL_COMMUNITY): Payer: Medicare Other

## 2017-08-23 DIAGNOSIS — H8113 Benign paroxysmal vertigo, bilateral: Secondary | ICD-10-CM | POA: Diagnosis not present

## 2017-08-23 NOTE — Therapy (Signed)
[facility] [address] [place], [place], [address] Phone: [phone]   Fax:  [phone]  Physical Therapy Treatment  Patient Details  Name: [name] MRN: [id number] Date of Birth: May 13, 1951 Referring Provider: Terrill Mohr  Encounter Date: 08/23/2017      PT End of Session - 08/23/17 0908    Visit Number 4   Number of Visits 8   Date for PT Re-Evaluation 09/08/17   Authorization Type medicare   Authorization - Visit Number 4   Authorization - Number of Visits 8   PT Start Time 0903   PT Stop Time 0943   PT Time Calculation (min) 40 min   Equipment Utilized During Treatment Gait belt   Activity Tolerance Patient tolerated treatment well   Behavior During Therapy [facility] for tasks assessed/performed      Past Medical History:  Diagnosis Date  . Arthritis   . Coronary artery disease    tandem 50/60% lesions in the LAD a 20% lesion the RCA catheterization 2004  . Degenerative disc disease   . Glucose intolerance (impaired glucose tolerance)   . HLD (hyperlipidemia)   . Impaired hearing   . Thyroid disease    not on meds  . Tinnitus   . Varicose veins     Past Surgical History:  Procedure Laterality Date  . JOINT REPLACEMENT    . KNEE SURGERY     both knees  . MOUTH SURGERY     all teeth removed at age 68  . repair peroneus tendon tear    . SHOULDER SURGERY     both shoulders    There were no vitals filed for this visit.      Subjective Assessment - 08/23/17 0905    Subjective Pt reports he has began new exercises and more aware of posture.  No reports of pain, does continue to have dizziness, reports less intensity and reduction in frequency.  Does continues to be dizziness wiht transitions.     Pertinent History CAD , tinnitus    Currently in Pain? No/denies                Vestibular Assessment - 08/23/17 0001      Positional Testing   Dix-Hallpike Dix-Hallpike Right     Dix-Hallpike  Right   Dix-Hallpike Right Symptoms No nystagmus     Dix-Hallpike Left   Dix-Hallpike Left Symptoms No nystagmus     Positional Sensitivities   Up from Right Hallpike Lightheadedness   Up from Left Hallpike Lightheadedness   Rolling Right No dizziness   Rolling Left Lightheadedness                  Vestibular Treatment/Exercise - 08/23/17 0001      Vestibular Treatment/Exercise   Canalith Repositioning Epley Manuever Left   Gaze Exercises Eye/Head Exercise Horizontal;Eye/Head Exercise Vertical      EPLEY MANUEVER LEFT   Number of Reps  1   Overall Response  Improved Symptoms    RESPONSE DETAILS LEFT no dizziness with transition sidelying to sit            Balance Exercises - 08/23/17 0935      Balance Exercises: Standing   Standing Eyes Closed Narrow base of support (BOS);Foam/compliant surface;3 reps;10 secs  NBOS on foam eyes closed 3x 10", NBOS on fwiht UE flexion 2#   Tandem Stance Eyes open;Foam/compliant surface;5 reps  with eye then head movements   Tandem Gait 2 reps  PT Short Term Goals - 08/09/17 1242      PT SHORT TERM GOAL #1   Title Pt to state that he is only experiencing sx of vertigo 2 x a day    Time 2   Period Weeks   Status New   Target Date 08/23/17     PT SHORT TERM GOAL #2   Title Pt to be able to single leg stance on both Le for at least 12 seconds to reduce risk of falling    Time 2   Period Weeks   Status New           PT Long Term Goals - 08/09/17 1243      PT LONG TERM GOAL #1   Title Pt to state that he has not had any sx of vertigo in the past week    Time 4   Period Weeks   Status New   Target Date 09/06/17     PT LONG TERM GOAL #2   Title Pt to be able to single leg stance for 20 seconds on both LE to decrease risk of falling    Time 4   Period Weeks   Status New     PT LONG TERM GOAL #3   Title Pt to be able to verbalize self manuevers to decrease symptoms if they return    Time 4    Period Weeks   Status New               Plan - 08/23/17 5035    Clinical Impression Statement Pt arrived with reports of increased awareness of posture and reports of improved balance with awareness.  Noted improved cervical mobility as well.  Pt does continue to c/o dizziness with transitions, able to complete Eply's manever with ability to transition sidelying to sitting with no reports of dizziness and decreased intensity with rolling.  Balance activities complete with focus on eye movements and dynamic surfaces.     Rehab Potential Good   PT Frequency 2x / week   PT Duration 4 weeks   PT Treatment/Interventions ADLs/Self Care Home Management;Patient/family education;Manual techniques;Therapeutic exercise;Balance training;Neuromuscular re-education   PT Next Visit Plan Give signed referral for orthotic Rt foot.  Repeat Eply if positive Hal-Pike Dix try to the Right ;continue higher  balance activity as well as manual for cervical area to improve ROM and decrease trap spasm.       Patient will benefit from skilled therapeutic intervention in order to improve the following deficits and impairments:  Dizziness, Decreased balance, Decreased range of motion  Visit Diagnosis: BPPV (benign paroxysmal positional vertigo), bilateral     Problem List Patient Active Problem List   Diagnosis Date Noted  . Class 1 obesity due to excess calories with body mass index (BMI) of 33.0 to 33.9 in adult 09/09/2016  . Pre-operative cardiovascular examination 06/16/2011  . Coronary artery disease-nonobstructive 50% lesions tandem and the LAD catheterization 2004 ejection fraction-50% 06/16/2011  . FATTY LIVER DISEASE 08/17/2010  . ARTHRITIS 08/17/2010  . FECAL OCCULT BLOOD 08/17/2010  . PANCREATITIS 08/13/2010  . HYPOTHYROIDISM 08/12/2010  . Diabetes mellitus ([place]) 08/12/2010  . HYPERCHOLESTEROLEMIA 08/12/2010  . GERD 08/12/2010  . HIATAL HERNIA 08/12/2010  . HEMOCCULT POSITIVE STOOL  08/12/2010   Ihor Austin, [place]; [place]  Aldona Lento 08/23/2017, 1:59 PM  [facility] [address] [place], [place], [address] Phone: [phone]   Fax:  [phone]  Name: [name] MRN: [id number] Date of Birth: 08-09-1951

## 2017-08-25 ENCOUNTER — Ambulatory Visit (HOSPITAL_COMMUNITY): Payer: Medicare Other | Attending: Family Medicine

## 2017-08-25 ENCOUNTER — Encounter (HOSPITAL_COMMUNITY): Payer: Self-pay

## 2017-08-25 DIAGNOSIS — H8113 Benign paroxysmal vertigo, bilateral: Secondary | ICD-10-CM | POA: Insufficient documentation

## 2017-08-25 NOTE — Therapy (Signed)
[facility] [place], [place], [address] Phone: [phone]   Fax:  [phone]  Physical Therapy Treatment  Patient Details  Name: [name] MRN: [id number] Date of Birth: 1951-08-11 Referring Provider: Terrill Mohr  Encounter Date: 08/25/2017      PT End of Session - 08/25/17 0919    Visit Number 5   Number of Visits 8   Date for PT Re-Evaluation 09/08/17   Authorization Type medicare   Authorization - Visit Number 5   Authorization - Number of Visits 8   PT Start Time 0903   PT Stop Time 0943   PT Time Calculation (min) 40 min   Equipment Utilized During Treatment Gait belt   Activity Tolerance Patient tolerated treatment well;No increased pain   Behavior During Therapy [facility] for tasks assessed/performed      Past Medical History:  Diagnosis Date  . Arthritis   . Coronary artery disease    tandem 50/60% lesions in the LAD a 20% lesion the RCA catheterization 2004  . Degenerative disc disease   . Glucose intolerance (impaired glucose tolerance)   . HLD (hyperlipidemia)   . Impaired hearing   . Thyroid disease    not on meds  . Tinnitus   . Varicose veins     Past Surgical History:  Procedure Laterality Date  . JOINT REPLACEMENT    . KNEE SURGERY     both knees  . MOUTH SURGERY     all teeth removed at age 82  . repair peroneus tendon tear    . SHOULDER SURGERY     both shoulders    There were no vitals filed for this visit.      Subjective Assessment - 08/25/17 0903    Subjective Pt stated dizzy episodes have reduced.  Reports Rt foot causes LOB, has attached some plastic to lateral part of Rt foot to help with foot mechanics.   Currently in Pain? No/denies                Vestibular Assessment - 08/25/17 0001      Positional Testing   Dix-Hallpike Dix-Hallpike Right     Dix-Hallpike Right   Dix-Hallpike Right Symptoms No nystagmus     Dix-Hallpike Left   Dix-Hallpike Left  Symptoms No nystagmus     Positional Sensitivities   Up from Right Hallpike Lightheadedness   Up from Left Hallpike Lightheadedness   Rolling Right No dizziness   Rolling Left Lightheadedness                  Vestibular Treatment/Exercise - 08/25/17 0001      Vestibular Treatment/Exercise   Canalith Repositioning Epley Manuever Left   Gaze Exercises Eye/Head Exercise Horizontal;Eye/Head Exercise Vertical      EPLEY MANUEVER LEFT   Number of Reps  1   Overall Response  Improved Symptoms    RESPONSE DETAILS LEFT dizziness only with transitions, with rotation from Rt to Lt             Balance Exercises - 08/25/17 0933      Balance Exercises: Standing   Tandem Stance Eyes open;Foam/compliant surface;5 reps;30 secs  static, eye movements, head movements, UE flexion 3# dowel    SLS Eyes open;5 reps  Lt 12", Rt 6"   SLS with Vectors Solid surface;3 reps  BLE 3x5"   Tandem Gait 2 reps   Retro Gait 1 rep   Sidestepping Head turns;2 reps;Theraband  RTB  Cone Rotation Foam/compliant surface;R/L  NBPS   Other Standing Exercises DGI x 500 ft'           PT Education - 08/25/17 0915    Education provided Yes   Education Details Given referral for orthotic to assist with Rt foot inversion, given list of places to purchase   Person(s) Educated Patient   Methods Explanation;Handout   Comprehension Verbalized understanding          PT Short Term Goals - 08/09/17 1242      PT SHORT TERM GOAL #1   Title Pt to state that he is only experiencing sx of vertigo 2 x a day    Time 2   Period Weeks   Status New   Target Date 08/23/17     PT SHORT TERM GOAL #2   Title Pt to be able to single leg stance on both Le for at least 12 seconds to reduce risk of falling    Time 2   Period Weeks   Status New           PT Long Term Goals - 08/09/17 1243      PT LONG TERM GOAL #1   Title Pt to state that he has not had any sx of vertigo in the past week    Time  4   Period Weeks   Status New   Target Date 09/06/17     PT LONG TERM GOAL #2   Title Pt to be able to single leg stance for 20 seconds on both LE to decrease risk of falling    Time 4   Period Weeks   Status New     PT LONG TERM GOAL #3   Title Pt to be able to verbalize self manuevers to decrease symptoms if they return    Time 4   Period Weeks   Status New               Plan - 08/25/17 0944    Clinical Impression Statement Pt continues to have difficulty wtih Rt foot placement with balance activities as ankle inverts with weight bearing, pt given signed referal for orthotics to assist.  Pt reports dizziness is reducing, does continue to c/o dizzy episodes with movement/transitions only with reduced time/intensity.  Progressed balance activities with additional DGI gait training and other balance activities.     Rehab Potential Good   PT Frequency 2x / week   PT Duration 4 weeks   PT Treatment/Interventions ADLs/Self Care Home Management;Patient/family education;Manual techniques;Therapeutic exercise;Balance training;Neuromuscular re-education   PT Next Visit Plan Repeat Eply if positive Hal-Pike Dix try to the Right ;continue higher  balance activity as well as manual for cervical area to improve ROM and decrease trap spasm.       Patient will benefit from skilled therapeutic intervention in order to improve the following deficits and impairments:  Dizziness, Decreased balance, Decreased range of motion  Visit Diagnosis: BPPV (benign paroxysmal positional vertigo), bilateral     Problem List Patient Active Problem List   Diagnosis Date Noted  . Class 1 obesity due to excess calories with body mass index (BMI) of 33.0 to 33.9 in adult 09/09/2016  . Pre-operative cardiovascular examination 06/16/2011  . Coronary artery disease-nonobstructive 50% lesions tandem and the LAD catheterization 2004 ejection fraction-50% 06/16/2011  . FATTY LIVER DISEASE 08/17/2010  .  ARTHRITIS 08/17/2010  . FECAL OCCULT BLOOD 08/17/2010  . PANCREATITIS 08/13/2010  . HYPOTHYROIDISM 08/12/2010  . Diabetes  mellitus ([place]) 08/12/2010  . HYPERCHOLESTEROLEMIA 08/12/2010  . GERD 08/12/2010  . HIATAL HERNIA 08/12/2010  . HEMOCCULT POSITIVE STOOL 08/12/2010   Ihor Austin, [place]; [place]  Aldona Lento 08/25/2017, 4:28 PM  [facility] [address] [place], [place], [address] Phone: [phone]   Fax:  [phone]  Name: GEORGIE HAQUE MRN: [id number] Date of Birth: 1951/02/08

## 2017-08-30 ENCOUNTER — Ambulatory Visit (HOSPITAL_COMMUNITY): Payer: Medicare Other

## 2017-08-30 ENCOUNTER — Encounter (HOSPITAL_COMMUNITY): Payer: Self-pay

## 2017-08-30 DIAGNOSIS — H8113 Benign paroxysmal vertigo, bilateral: Secondary | ICD-10-CM | POA: Diagnosis not present

## 2017-08-30 NOTE — Patient Instructions (Addendum)
Tandem Stance    Right foot in front of left, heel touching toe both feet "straight ahead". Stand on Foot Triangle of Support with both feet. Balance in this position 30 seconds. Do with left foot in front of right.  Copyright  VHI. All rights reserved.   SINGLE LIMB STANCE    Stance: single leg on floor. Raise leg. Hold 30 seconds. Repeat with other leg. 3 reps per set, 2 sets per day.  Copyright  VHI. All rights reserved.   Heel Raise: Bilateral (Standing)    Rise on balls of feet. Repeat 15 times per set. Do 2 sets per day. http://orth.exer.us/38   Copyright  VHI. All rights reserved.   Inversion (Eccentric), (Resistance Band)    Pull foot in against resistance band. Slowly release for 3-5 seconds. Use green resistance band. 15 reps per set, 1-2 sets per day http://ecce.exer.us/4   Copyright  VHI. All rights reserved.   Ankle Plantar Flexion: Long-Sitting (Single Leg)    Loop tubing around foot of straight leg, anchor with one hand. Leg straight, point toes downward. Repeat __ times per set. Repeat with other leg. Do __ sets per session. Do __ sessions per week.  http://tub.exer.us/215   Copyright  VHI. All rights reserved.

## 2017-08-30 NOTE — Therapy (Signed)
[place], [place], [address] Phone: [phone]   Fax:  [phone]  Physical Therapy Treatment  Patient Details  Name: [name] MRN: [id number] Date of Birth: [birth year] Referring Provider: Terrill Mohr   Encounter Date: 08/30/2017  PT End of Session - 08/30/17 0909    Visit Number  6    Number of Visits  8    Date for PT Re-Evaluation  09/08/17    Authorization Type  medicare    Authorization - Visit Number  6    Authorization - Number of Visits  8    PT Start Time  0905    PT Stop Time  0945    PT Time Calculation (min)  40 min    Equipment Utilized During Treatment  Gait belt    Activity Tolerance  Patient tolerated treatment well;No increased pain    Behavior During Therapy  WFL for tasks assessed/performed       Past Medical History:  Diagnosis Date  . Arthritis   . Coronary artery disease    tandem 50/60% lesions in the LAD a 20% lesion the RCA catheterization 2004  . Degenerative disc disease   . Glucose intolerance (impaired glucose tolerance)   . HLD (hyperlipidemia)   . Impaired hearing   . Thyroid disease    not on meds  . Tinnitus   . Varicose veins     Past Surgical History:  Procedure Laterality Date  . JOINT REPLACEMENT    . KNEE SURGERY     both knees  . MOUTH SURGERY     all teeth removed at age 58  . repair peroneus tendon tear    . SHOULDER SURGERY     both shoulders    There were no vitals filed for this visit.  Subjective Assessment - 08/30/17 0907    Subjective  Pt stated dizziness has been resolved.  Continues to have difficulty with balance, reports he fell on a hill in mud yesterday not due to dizziness.  Able to get up independently with no pain following.      Currently in Pain?  No/denies                      [facility] Adult PT Treatment/Exercise - 08/30/17 0001      Lumbar Exercises: Standing   Heel Raises  15 reps    Functional Squats   10 reps      Lumbar Exercises: Seated   Other Seated Lumbar Exercises  ankle inversion/eversion, plantar flexion 15reps with GTB, HEP          Balance Exercises - 08/30/17 0939      Balance Exercises: Standing   Tandem Stance  Eyes open;Foam/compliant surface;3 reps;30 secs    SLS  Eyes open;5 reps Rt 5", Lt 9"   Rt 5", Lt 9"   SLS with Vectors  Solid surface;3 reps 5x5"   5x5"   Balance Beam  tandem and sidestep 2RT each    Heel Raises Limitations  15    Toe Raise Limitations  15          PT Short Term Goals - 08/09/17 1242      PT SHORT TERM GOAL #1   Title  Pt to state that he is only experiencing sx of vertigo 2 x a day     Time  2    Period  Weeks    Status  New  Target Date  08/23/17      PT SHORT TERM GOAL #2   Title  Pt to be able to single leg stance on both Le for at least 12 seconds to reduce risk of falling     Time  2    Period  Weeks    Status  New        PT Long Term Goals - 08/09/17 1243      PT LONG TERM GOAL #1   Title  Pt to state that he has not had any sx of vertigo in the past week     Time  4    Period  Weeks    Status  New    Target Date  09/06/17      PT LONG TERM GOAL #2   Title  Pt to be able to single leg stance for 20 seconds on both LE to decrease risk of falling     Time  4    Period  Weeks    Status  New      PT LONG TERM GOAL #3   Title  Pt to be able to verbalize self manuevers to decrease symptoms if they return     Time  4    Period  Weeks    Status  New            Plan - 08/30/17 0315    Clinical Impression Statement  Pt arrived with reports of dizziness resolved, session focus on balance activities.  Progressed to balance beam, pt with increased difficulty especially with Rt foot.  Pt continues to demonstrate weakness in ankle musculature, added ankle strengthening exercises to POC and additional exercises to HEP.      Rehab Potential  Good    PT Frequency  2x / week    PT Duration  4 weeks    PT  Treatment/Interventions  ADLs/Self Care Home Management;Patient/family education;Manual techniques;Therapeutic exercise;Balance training;Neuromuscular re-education    PT Next Visit Plan  Continue higher level balance activiteis and ankle strengthening.  Eply manuever to Rt PRN.      PT Home Exercise Plan  eye gaze exercises; 11/07: tandem stance, SLS, heel raises, theraband for ankle strengthening       Patient will benefit from skilled therapeutic intervention in order to improve the following deficits and impairments:  Dizziness, Decreased balance, Decreased range of motion  Visit Diagnosis: BPPV (benign paroxysmal positional vertigo), bilateral     Problem List Patient Active Problem List   Diagnosis Date Noted  . Class 1 obesity due to excess calories with body mass index (BMI) of 33.0 to 33.9 in adult 09/09/2016  . Pre-operative cardiovascular examination 06/16/2011  . Coronary artery disease-nonobstructive 50% lesions tandem and the LAD catheterization 2004 ejection fraction-50% 06/16/2011  . FATTY LIVER DISEASE 08/17/2010  . ARTHRITIS 08/17/2010  . FECAL OCCULT BLOOD 08/17/2010  . PANCREATITIS 08/13/2010  . HYPOTHYROIDISM 08/12/2010  . Diabetes mellitus ([place]) 08/12/2010  . HYPERCHOLESTEROLEMIA 08/12/2010  . GERD 08/12/2010  . HIATAL HERNIA 08/12/2010  . HEMOCCULT POSITIVE STOOL 08/12/2010   Ihor Austin, [place]; [place]  Aldona Lento 08/30/2017, 1:08 PM  [place], [place], [address] Phone: [phone]   Fax:  [phone]  Name: COLE EASTRIDGE MRN: [id number] Date of Birth: 11-03-1950

## 2017-09-01 ENCOUNTER — Ambulatory Visit (HOSPITAL_COMMUNITY): Payer: Medicare Other

## 2017-09-01 ENCOUNTER — Telehealth (HOSPITAL_COMMUNITY): Payer: Self-pay

## 2017-09-01 NOTE — Telephone Encounter (Signed)
Pt cancelled session due to having the flu.  Aware of next apt date and time.    [address], [place]; [facility] [phone]

## 2017-09-06 ENCOUNTER — Ambulatory Visit (HOSPITAL_COMMUNITY): Payer: Medicare Other | Admitting: Physical Therapy

## 2017-09-06 ENCOUNTER — Other Ambulatory Visit: Payer: Self-pay

## 2017-09-06 ENCOUNTER — Encounter (HOSPITAL_COMMUNITY): Payer: Self-pay | Admitting: Physical Therapy

## 2017-09-06 DIAGNOSIS — H8113 Benign paroxysmal vertigo, bilateral: Secondary | ICD-10-CM | POA: Diagnosis not present

## 2017-09-06 NOTE — Therapy (Signed)
[place], [place], [address] Phone: [phone]   Fax:  [phone]  Physical Therapy Treatment  Patient Details  Name: [name] MRN: [id number] Date of Birth: 1951-02-19 Referring Provider: Terrill Mohr   Encounter Date: 09/06/2017  PT End of Session - 09/06/17 0945    Visit Number  7    Number of Visits  8    Date for PT Re-Evaluation  09/08/17    Authorization Type  medicare    Authorization - Visit Number  7    Authorization - Number of Visits  8    PT Start Time  0904    PT Stop Time  0944    PT Time Calculation (min)  40 min    Equipment Utilized During Treatment  Gait belt    Activity Tolerance  Patient tolerated treatment well;No increased pain    Behavior During Therapy  WFL for tasks assessed/performed       Past Medical History:  Diagnosis Date  . Arthritis   . Coronary artery disease    tandem 50/60% lesions in the LAD a 20% lesion the RCA catheterization 2004  . Degenerative disc disease   . Glucose intolerance (impaired glucose tolerance)   . HLD (hyperlipidemia)   . Impaired hearing   . Thyroid disease    not on meds  . Tinnitus   . Varicose veins     Past Surgical History:  Procedure Laterality Date  . JOINT REPLACEMENT    . KNEE SURGERY     both knees  . MOUTH SURGERY     all teeth removed at age 29  . repair peroneus tendon tear    . SHOULDER SURGERY     both shoulders    There were no vitals filed for this visit.  Subjective Assessment - 09/06/17 0905    Subjective  Pt states that he continues to have no sx of dizziness but his balance remains a problem.    Currently in Pain?  No/denies                           Balance Exercises - 09/06/17 0912      Balance Exercises: Standing   Tandem Stance  Eyes open;Foam/compliant surface;Other reps (comment) head turns 10 reps     SLS with Vectors  Solid surface;3 reps;10 secs    Rockerboard   Anterior/posterior;Lateral    Balance Master: Limits for Stability  1:38 x 2    Balance Master: Dynamic  1:00 x 2     Tandem Gait  Foam/compliant surface;2 reps    Retro Gait  Foam/compliant surface;2 reps    Other Standing Exercises  fitter 1 finger each hand x 1"          PT Short Term Goals - 08/09/17 1242      PT SHORT TERM GOAL #1   Title  Pt to state that he is only experiencing sx of vertigo 2 x a day     Time  2    Period  Weeks    Status  New    Target Date  08/23/17      PT SHORT TERM GOAL #2   Title  Pt to be able to single leg stance on both Le for at least 12 seconds to reduce risk of falling     Time  2    Period  Weeks    Status  New        PT Long Term Goals - 08/09/17 1243      PT LONG TERM GOAL #1   Title  Pt to state that he has not had any sx of vertigo in the past week     Time  4    Period  Weeks    Status  New    Target Date  09/06/17      PT LONG TERM GOAL #2   Title  Pt to be able to single leg stance for 20 seconds on both LE to decrease risk of falling     Time  4    Period  Weeks    Status  New      PT LONG TERM GOAL #3   Title  Pt to be able to verbalize self manuevers to decrease symptoms if they return     Time  4    Period  Weeks    Status  New            Plan - 09/06/17 0945    Clinical Impression Statement  Pt is working on his balance at home.  Added Pension scheme manager and fitter to balance program today with noted challenge.  Pt vertigo continues to be absent.     Rehab Potential  Good    PT Frequency  2x / week    PT Duration  4 weeks    PT Treatment/Interventions  ADLs/Self Care Home Management;Patient/family education;Manual techniques;Therapeutic exercise;Balance training;Neuromuscular re-education    PT Next Visit Plan  reassess next visit.    PT Home Exercise Plan  eye gaze exercises; 11/07: tandem stance, SLS, heel raises, theraband for ankle strengthening       Patient will benefit from skilled therapeutic  intervention in order to improve the following deficits and impairments:  Dizziness, Decreased balance, Decreased range of motion  Visit Diagnosis: BPPV (benign paroxysmal positional vertigo), bilateral     Problem List Patient Active Problem List   Diagnosis Date Noted  . Class 1 obesity due to excess calories with body mass index (BMI) of 33.0 to 33.9 in adult 09/09/2016  . Pre-operative cardiovascular examination 06/16/2011  . Coronary artery disease-nonobstructive 50% lesions tandem and the LAD catheterization 2004 ejection fraction-50% 06/16/2011  . FATTY LIVER DISEASE 08/17/2010  . ARTHRITIS 08/17/2010  . FECAL OCCULT BLOOD 08/17/2010  . PANCREATITIS 08/13/2010  . HYPOTHYROIDISM 08/12/2010  . Diabetes mellitus ([place]) 08/12/2010  . HYPERCHOLESTEROLEMIA 08/12/2010  . GERD 08/12/2010  . HIATAL HERNIA 08/12/2010  . HEMOCCULT POSITIVE STOOL 08/12/2010    Rayetta Humphrey, PT CLT [phone] 09/06/2017, 9:47 AM  [place] [address] [place], [place], [address] Phone: [phone]   Fax:  [phone]  Name: [name] MRN: [id number] Date of Birth: 1951/08/17

## 2017-09-08 ENCOUNTER — Encounter (HOSPITAL_COMMUNITY): Payer: Self-pay | Admitting: Physical Therapy

## 2017-09-08 ENCOUNTER — Ambulatory Visit (HOSPITAL_COMMUNITY): Payer: Medicare Other | Admitting: Physical Therapy

## 2017-09-08 DIAGNOSIS — H8113 Benign paroxysmal vertigo, bilateral: Secondary | ICD-10-CM

## 2017-09-08 NOTE — Therapy (Signed)
[place], [place], [address] Phone: [phone]   Fax:  [phone]  Physical Therapy Treatment/Discharge  Patient Details  Name: [name] MRN: [id number] Date of Birth: 1951-04-22 Referring Provider: Terrill Mohr   Encounter Date: 09/08/2017  PT End of Session - 09/08/17 0925    Visit Number  8    Number of Visits  8    Date for PT Re-Evaluation  09/08/17    Authorization Type  medicare    Authorization - Visit Number  8    Authorization - Number of Visits  8    PT Start Time  0910    PT Stop Time  0920    PT Time Calculation (min)  10 min    Activity Tolerance  Patient tolerated treatment well;No increased pain    Behavior During Therapy  WFL for tasks assessed/performed       Past Medical History:  Diagnosis Date  . Arthritis   . Coronary artery disease    tandem 50/60% lesions in the LAD a 20% lesion the RCA catheterization 2004  . Degenerative disc disease   . Glucose intolerance (impaired glucose tolerance)   . HLD (hyperlipidemia)   . Impaired hearing   . Thyroid disease    not on meds  . Tinnitus   . Varicose veins     Past Surgical History:  Procedure Laterality Date  . JOINT REPLACEMENT    . KNEE SURGERY     both knees  . MOUTH SURGERY     all teeth removed at age 83  . repair peroneus tendon tear    . SHOULDER SURGERY     both shoulders    There were no vitals filed for this visit.  Subjective Assessment - 09/08/17 0908    Subjective  Pt states that he is doing his balance exercises at home he reports no dizziness.      How long can you stand comfortably?  as long as he wants    How long can you walk comfortably?  as long as he wants    Currently in Pain?  No/denies         [facility] PT Assessment - 09/08/17 0001      Assessment   Medical Diagnosis  BPPV    Referring Provider  Terrill Mohr    Onset Date/Surgical Date  06/09/17    Next MD Visit  not scheduled     Prior Therapy  none      Precautions   Precautions  None      Restrictions   Weight Bearing Restrictions  Yes      Balance Screen   Has the patient fallen in the past 6 months  No    Has the patient had a decrease in activity level because of a fear of falling?   No    Is the patient reluctant to leave their home because of a fear of falling?   No      Home Film/video editor residence      Prior Function   Vocation  Part time employment    Vocation Requirements  maintenance       Cognition   Overall Cognitive Status  Within Functional Limits for tasks assessed      Observation/Other Assessments   Focus on Therapeutic Outcomes (FOTO)   97 was 81      Functional Tests   Functional tests  Single  leg stance      Single Leg Stance   Comments  R 9 was  2 seconds; Lt 6       Posture/Postural Control   Posture/Postural Control  Postural limitations    Postural Limitations  Rounded Shoulders;Forward head    Posture Comments  Tight in upper trap mm          Vestibular Assessment - 09/08/17 0001      Vestibular Assessment   General Observation  normal       Symptom Behavior   Type of Dizziness  Comment none now was world spinning     Frequency of Dizziness  -- none now was 3-4 x a day    Duration of Dizziness  --    Aggravating Factors  --    Relieving Factors  --      Occulomotor Exam   Occulomotor Alignment  Normal    Spontaneous  Absent    Gaze-induced  Absent    Head shaking Horizontal  Absent    Smooth Pursuits  Intact    Saccades  --      Vestibulo-Occular Reflex   VOR 1 Head Only (x 1 viewing)  negative     VOR 2 Head and Object (x 2 viewing)  negative       Positional Testing   Dix-Hallpike  Dix-Hallpike Right;Dix-Hallpike Left      Dix-Hallpike Right   Dix-Hallpike Right Duration  --    Dix-Hallpike Right Symptoms  No nystagmus      Dix-Hallpike Left   Dix-Hallpike Left Duration  --    Dix-Hallpike Left Symptoms  No  nystagmus      Cognition   Cognition Orientation Level  Appropriate for developmental age      Positional Sensitivities   Up from Right Hallpike  Mild dizziness    Up from Left Hallpike  Severe dizziness    Rolling Left  Severe dizziness                      PT Education - 09/08/17 0924    Education provided  Yes    Education Details  Pt can stop exercises but keep sheets; if sx reoccur start eye and movement exercises as soon as possible     Person(s) Educated  Patient    Methods  Explanation    Comprehension  Verbalized understanding       PT Short Term Goals - 09/08/17 0914      PT SHORT TERM GOAL #1   Title  Pt to state that he is only experiencing sx of vertigo 2 x a day     Time  2    Period  Weeks    Status  Achieved      PT SHORT TERM GOAL #2   Title  Pt to be able to single leg stance on both Le for at least 12 seconds to reduce risk of falling     Baseline  Rt 9 seconds Lt 40    Time  2    Period  Weeks    Status  Partially Met        PT Long Term Goals - 09/08/17 0914      PT LONG TERM GOAL #1   Title  Pt to state that he has not had any sx of vertigo in the past week     Time  4    Period  Weeks    Status  Achieved  PT LONG TERM GOAL #2   Title  Pt to be able to single leg stance for 20 seconds on both LE to decrease risk of falling     Time  4    Period  Weeks    Status  Partially Met      PT LONG TERM GOAL #3   Title  Pt to be able to verbalize self manuevers to decrease symptoms if they return     Time  4    Period  Weeks    Status  Achieved            Plan - 09/29/2017 0925    Clinical Impression Statement  Pt reassessed he no longer has any episodes of dizziness and has returned to playing golf.  Pt balance has improved 4x but is still limited on his Rt side due to past ankle injury.  PT verbalizes the need to continue to work on his balance at home.     Rehab Potential  Good    PT Frequency  2x / week    PT  Duration  4 weeks    PT Treatment/Interventions  ADLs/Self Care Home Management;Patient/family education;Manual techniques;Therapeutic exercise;Balance training;Neuromuscular re-education    PT Next Visit Plan  discharge.     PT Home Exercise Plan  eye gaze exercises; 11/07: tandem stance, SLS, heel raises, theraband for ankle strengthening       Patient will benefit from skilled therapeutic intervention in order to improve the following deficits and impairments:  Dizziness, Decreased balance, Decreased range of motion  Visit Diagnosis: BPPV (benign paroxysmal positional vertigo), bilateral   G-Codes - 09/29/2017 0928    Functional Limitation  Changing and maintaining body position    Changing and Maintaining Body Position Goal Status (P3790)  At least 1 percent but less than 20 percent impaired, limited or restricted    Changing and Maintaining Body Position Discharge Status (W4097)  At least 1 percent but less than 20 percent impaired, limited or restricted       Problem List Patient Active Problem List   Diagnosis Date Noted  . Class 1 obesity due to excess calories with body mass index (BMI) of 33.0 to 33.9 in adult 09/09/2016  . Pre-operative cardiovascular examination 06/16/2011  . Coronary artery disease-nonobstructive 50% lesions tandem and the LAD catheterization 2004 ejection fraction-50% 06/16/2011  . FATTY LIVER DISEASE 08/17/2010  . ARTHRITIS 08/17/2010  . FECAL OCCULT BLOOD 08/17/2010  . PANCREATITIS 08/13/2010  . HYPOTHYROIDISM 08/12/2010  . Diabetes mellitus ([place]) 08/12/2010  . HYPERCHOLESTEROLEMIA 08/12/2010  . GERD 08/12/2010  . HIATAL HERNIA 08/12/2010  . HEMOCCULT POSITIVE STOOL 08/12/2010    Rayetta Humphrey, PT CLT [phone] 2017/09/29, 9:28 AM  [place], [place], [address] Phone: [phone]   Fax:  [phone]  Name: [name] MRN: [id number] Date of Birth:  1950/12/23  PHYSICAL THERAPY DISCHARGE SUMMARY  Visits from Start of Care: 8  Current functional level related to goals / functional outcomes: See above   Remaining deficits: Balance on Rt LE   Education / Equipment: HEP Plan: Patient agrees to discharge.  Patient goals were met. Patient is being discharged due to meeting the stated rehab goals.  ?????       Rayetta Humphrey, [place] CLT [phone]

## 2018-07-18 ENCOUNTER — Other Ambulatory Visit: Payer: Self-pay | Admitting: Physician Assistant

## 2018-07-18 DIAGNOSIS — L570 Actinic keratosis: Secondary | ICD-10-CM | POA: Diagnosis not present

## 2018-07-18 DIAGNOSIS — L720 Epidermal cyst: Secondary | ICD-10-CM | POA: Diagnosis not present

## 2018-07-18 DIAGNOSIS — D485 Neoplasm of uncertain behavior of skin: Secondary | ICD-10-CM | POA: Diagnosis not present

## 2018-07-30 DIAGNOSIS — Z4802 Encounter for removal of sutures: Secondary | ICD-10-CM | POA: Diagnosis not present

## 2018-08-01 DIAGNOSIS — L723 Sebaceous cyst: Secondary | ICD-10-CM | POA: Diagnosis not present

## 2018-08-10 IMAGING — DX DG ORBITS FOR FOREIGN BODY
2 series · 2 of 2 positions shown · non-contrast
Comparison: February 28, 2013

CLINICAL DATA: Metal working/exposure; clearance prior to MRI

EXAM:
ORBITS FOR FOREIGN BODY - 2 VIEW

[orbits waters (1 of 2)]
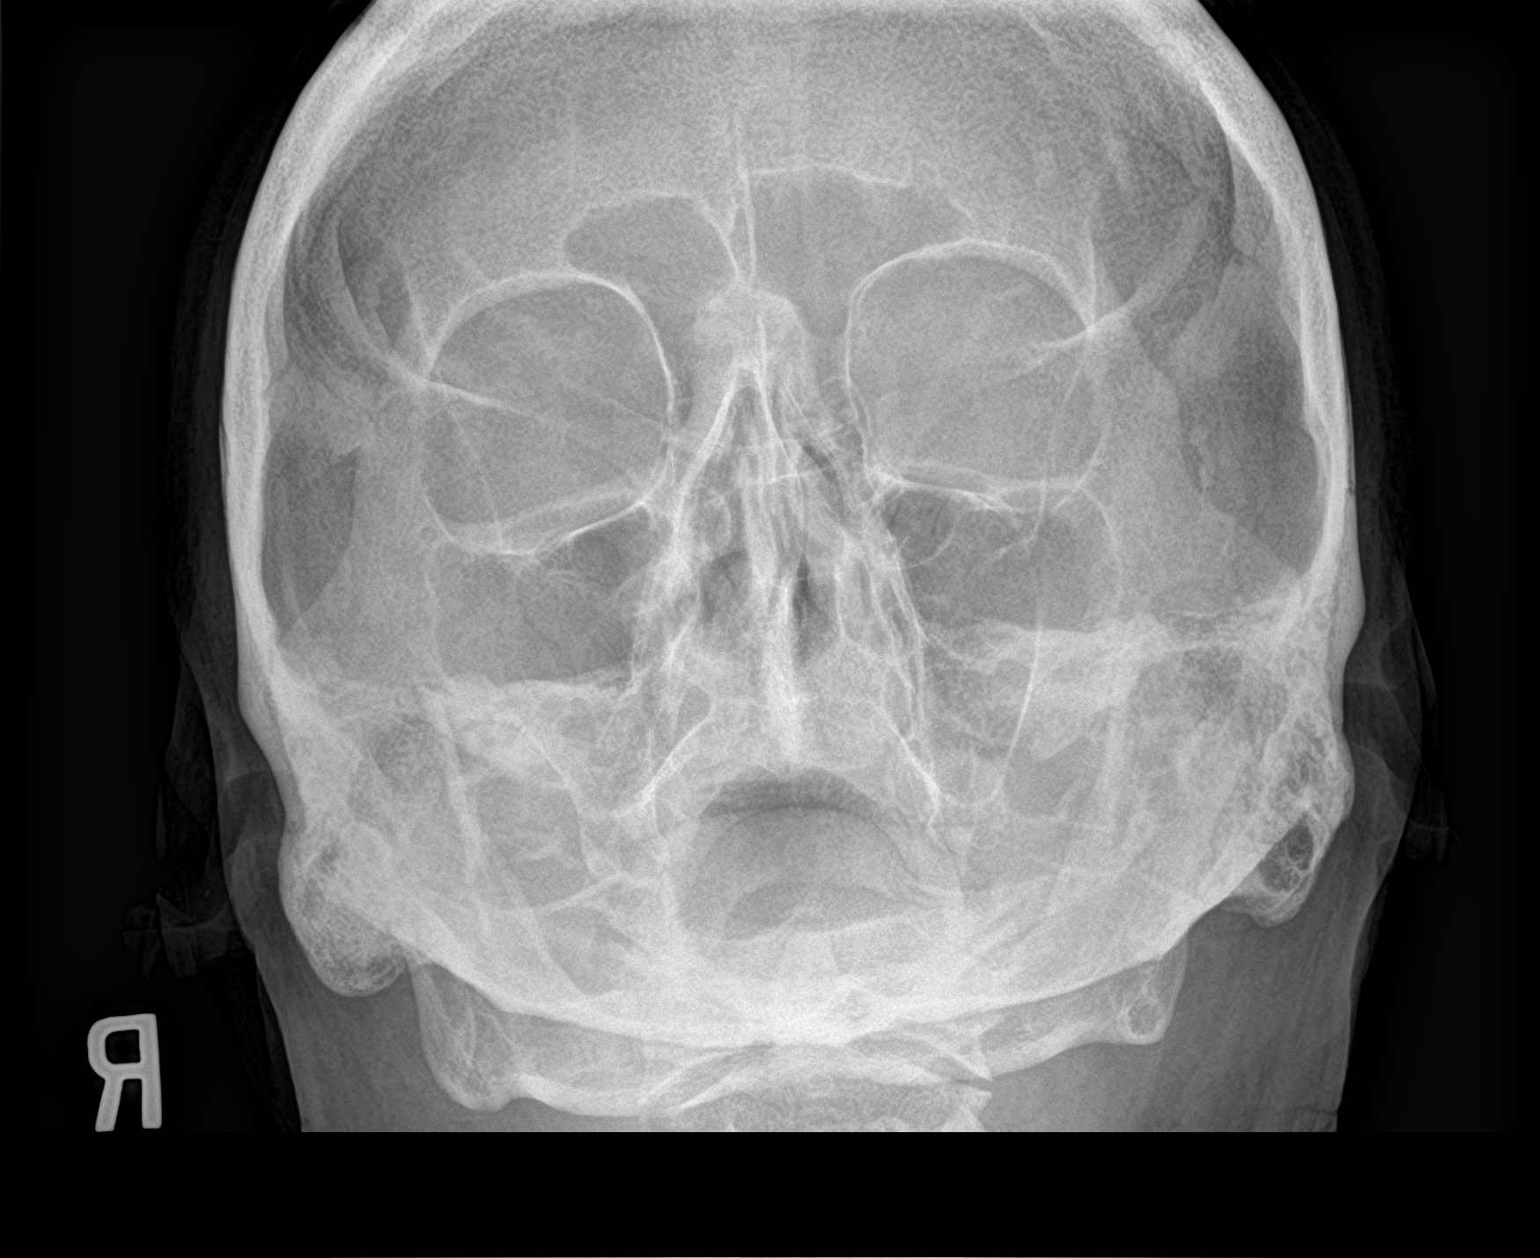

[orbits waters (2 of 2)]
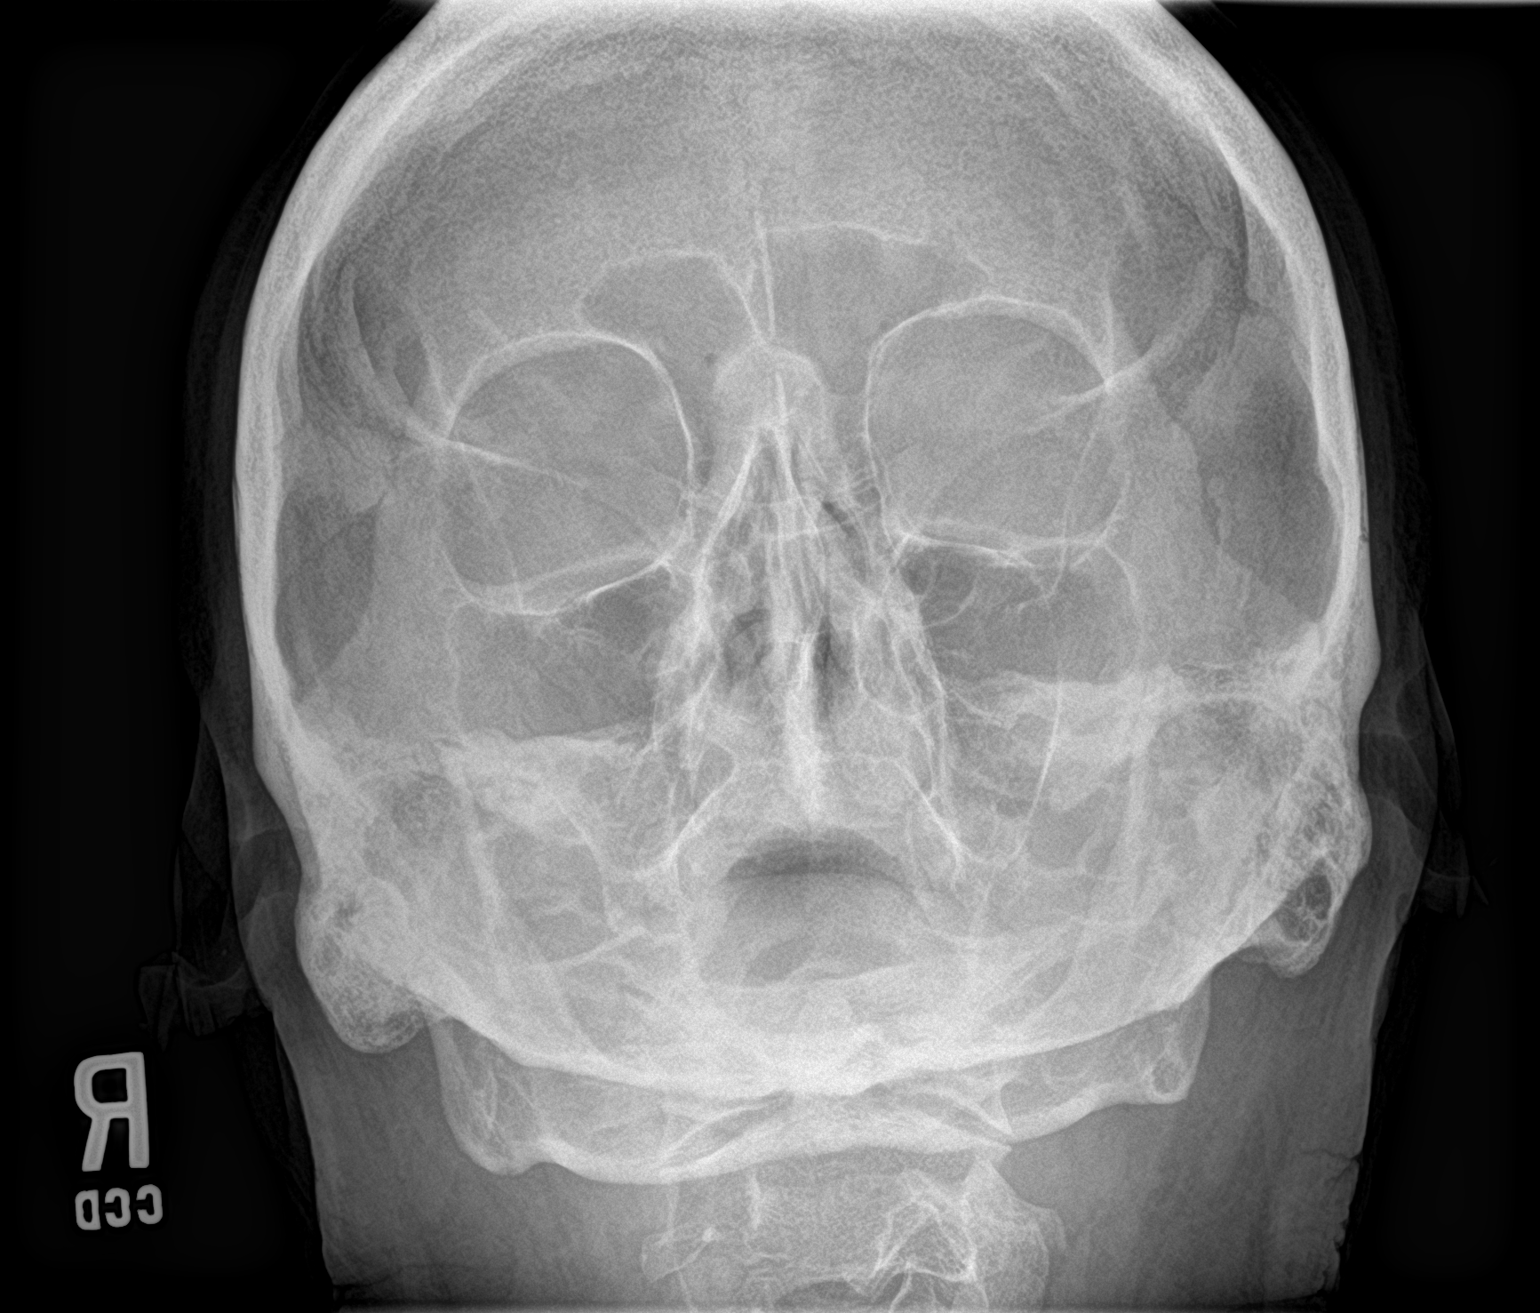

[2 of 2 positions shown; findings below may reference images not displayed]

FINDINGS: Water's views with eyes deviated toward the left and toward the
right were obtained. No intraorbital radiopaque foreign body.
Paranasal sinuses are clear. No fracture evident. There is deviation
of the nasal septum toward the left.
IMPRESSION: No evidence of metallic foreign body within the orbits.

## 2018-09-03 ENCOUNTER — Other Ambulatory Visit: Payer: Self-pay

## 2018-09-03 ENCOUNTER — Encounter (HOSPITAL_BASED_OUTPATIENT_CLINIC_OR_DEPARTMENT_OTHER): Payer: Self-pay | Admitting: *Deleted

## 2018-09-04 ENCOUNTER — Other Ambulatory Visit: Payer: Self-pay

## 2018-09-04 ENCOUNTER — Encounter (HOSPITAL_BASED_OUTPATIENT_CLINIC_OR_DEPARTMENT_OTHER)
Admission: RE | Admit: 2018-09-04 | Discharge: 2018-09-04 | Disposition: A | Discharge status: Home / Self Care | Payer: Medicare Other | Source: Ambulatory Visit | Attending: Surgery | Admitting: Surgery

## 2018-09-04 DIAGNOSIS — Z7984 Long term (current) use of oral hypoglycemic drugs: Secondary | ICD-10-CM | POA: Diagnosis not present

## 2018-09-04 DIAGNOSIS — L720 Epidermal cyst: Secondary | ICD-10-CM | POA: Diagnosis not present

## 2018-09-04 DIAGNOSIS — I2510 Atherosclerotic heart disease of native coronary artery without angina pectoris: Secondary | ICD-10-CM | POA: Diagnosis not present

## 2018-09-04 DIAGNOSIS — Z01818 Encounter for other preprocedural examination: Secondary | ICD-10-CM | POA: Insufficient documentation

## 2018-09-04 DIAGNOSIS — E119 Type 2 diabetes mellitus without complications: Secondary | ICD-10-CM | POA: Diagnosis not present

## 2018-09-04 LAB — BASIC METABOLIC PANEL
Anion gap: 7 (ref 5–15)
BUN: 9 mg/dL (ref 8–23)
CALCIUM: 8.9 mg/dL (ref 8.9–10.3)
CO2: 29 mmol/L (ref 22–32)
CREATININE: 0.75 mg/dL (ref 0.61–1.24)
Chloride: 97 mmol/L — ABNORMAL LOW (ref 98–111)
GFR calc Af Amer: >60 mL/min (ref >60)
GLUCOSE: 282 mg/dL — AB (ref 70–99)
Potassium: 4.6 mmol/L (ref 3.5–5.1)
Sodium: 133 mmol/L — ABNORMAL LOW (ref 135–145)

## 2018-09-04 NOTE — Progress Notes (Addendum)
EKG reviewed by Dr. Royce Macadamia, will proceed with surgery as scheduled.  Ensure pre surgery drink given with instructions to complete by [facility], pt verbalized understanding.

## 2018-09-06 ENCOUNTER — Encounter (HOSPITAL_BASED_OUTPATIENT_CLINIC_OR_DEPARTMENT_OTHER): Payer: Self-pay | Admitting: *Deleted

## 2018-09-06 NOTE — H&P (Signed)
Ronna Polio Documented: 08/01/2018 9:20 AM Location: [place] Office Patient #: [id number] DOB: May 29, 1951 Married / Language: English / Race: White Male   History of Present Illness [name] Key B. Hassell Done MD; 08/01/2018 9:50 AM) The patient is a 67 year old male who presents with an epidermal cyst. Mister and Mrs. Mikles came in today to discuss a cyst on the back of his neck. This is been removed before and is coming back. It is approximately 1-1/2 cm in diameter and is at the hairline slightly to the right of midline. It is nontender and not red but is consistent with a recurrent sebaceous cyst. I think I can see [place] punctum there. He does have very thick sun exposed skin there. Would schedule to excise this a [facility] day surgery under local with MAC.   Past Surgical History Malachi Bonds, CMA; 08/01/2018 9:20 AM) Knee Surgery  Bilateral. Shoulder Surgery  Bilateral.  Diagnostic Studies History Malachi Bonds, CMA; 08/01/2018 9:20 AM) Colonoscopy  5-10 years ago  Allergies Malachi Bonds, CMA; 08/01/2018 9:21 AM) No Known Drug Allergies [08/01/2018]:  Medication History Malachi Bonds, CMA; 08/01/2018 9:21 AM) No Current Medications Medications Reconciled  Social History Malachi Bonds, CMA; 08/01/2018 9:20 AM) Caffeine use  Carbonated beverages, Coffee, Tea. No alcohol use  No drug use  Tobacco use  Never smoker.  Family History Malachi Bonds, CMA; 08/01/2018 9:20 AM) Cerebrovascular Accident  Brother. Diabetes Mellitus  Brother, Sister. Heart Disease  Brother, Father, Mother. Heart disease in male family member before age 65  Hypertension  Mother.  Other Problems Malachi Bonds, CMA; 08/01/2018 9:20 AM) Arthritis  Back Pain  Hypercholesterolemia  Thyroid Disease     Review of Systems (Chemira Jones CMA; 08/01/2018 9:20 AM) General Not Present- Appetite Loss, Chills, Fatigue, Fever, Night Sweats, Weight Gain and Weight Loss. Skin Not Present- Change in  Wart/Mole, Dryness, Hives, Jaundice, New Lesions, Non-Healing Wounds, Rash and Ulcer. HEENT Not Present- Earache, Hearing Loss, Hoarseness, Nose Bleed, Oral Ulcers, Ringing in the Ears, Seasonal Allergies, Sinus Pain, Sore Throat, Visual Disturbances, Wears glasses/contact lenses and Yellow Eyes. Respiratory Not Present- Bloody sputum, Chronic Cough, Difficulty Breathing, Snoring and Wheezing. Breast Not Present- Breast Mass, Breast Pain, Nipple Discharge and Skin Changes. Cardiovascular Not Present- Chest Pain, Difficulty Breathing Lying Down, Leg Cramps, Palpitations, Rapid Heart Rate, Shortness of Breath and Swelling of Extremities. Gastrointestinal Not Present- Abdominal Pain, Bloating, Bloody Stool, Change in Bowel Habits, Chronic diarrhea, Constipation, Difficulty Swallowing, Excessive gas, Gets full quickly at meals, Hemorrhoids, Indigestion, Nausea, Rectal Pain and Vomiting. Male Genitourinary Present- Impotence. Not Present- Blood in Urine, Change in Urinary Stream, Frequency, Nocturia, Painful Urination, Urgency and Urine Leakage. Musculoskeletal Not Present- Back Pain, Joint Pain, Joint Stiffness, Muscle Pain, Muscle Weakness and Swelling of Extremities. Neurological Not Present- Decreased Memory, Fainting, Headaches, Numbness, Seizures, Tingling, Tremor, Trouble walking and Weakness. Psychiatric Not Present- Anxiety, Bipolar, Change in Sleep Pattern, Depression, Fearful and Frequent crying. Endocrine Not Present- Cold Intolerance, Excessive Hunger, Hair Changes, Heat Intolerance, Hot flashes and New Diabetes. Hematology Not Present- Blood Thinners, Easy Bruising, Excessive bleeding, Gland problems, HIV and Persistent Infections.  Vitals (Chemira Jones CMA; 08/01/2018 9:20 AM) 08/01/2018 9:20 AM Weight: 244.4 lb Height: 73in Body Surface Area: 2.34 m Body Mass Index: 32.24 kg/m  Pulse: 76 (Regular)  BP: 132/80 (Sitting, Left Arm, Standard)       Physical Exam (Candler Ginsberg B.  Hassell Done MD; 08/01/2018 9:52 AM) Head and Neck Note: Older WM NAD Neck: there is ~ 1.5 cm mass, nontender,  not erythematous that is consistent with an epidermal cyst.     Assessment & Plan [name] Key B. 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

## 2018-09-07 ENCOUNTER — Ambulatory Visit (HOSPITAL_BASED_OUTPATIENT_CLINIC_OR_DEPARTMENT_OTHER)
Admission: RE | Admit: 2018-09-07 | Discharge: 2018-09-07 | Disposition: A | Discharge status: Home / Self Care | Payer: Medicare Other | Source: Ambulatory Visit | Attending: Surgery | Admitting: Surgery

## 2018-09-07 ENCOUNTER — Ambulatory Visit (HOSPITAL_BASED_OUTPATIENT_CLINIC_OR_DEPARTMENT_OTHER): Payer: Medicare Other | Admitting: Anesthesiology

## 2018-09-07 ENCOUNTER — Encounter (HOSPITAL_BASED_OUTPATIENT_CLINIC_OR_DEPARTMENT_OTHER): Payer: Self-pay

## 2018-09-07 ENCOUNTER — Other Ambulatory Visit: Payer: Self-pay

## 2018-09-07 ENCOUNTER — Encounter (HOSPITAL_BASED_OUTPATIENT_CLINIC_OR_DEPARTMENT_OTHER)
Admission: RE | Disposition: A | Discharge status: Home / Self Care | Payer: Self-pay | Source: Ambulatory Visit | Attending: Surgery

## 2018-09-07 DIAGNOSIS — E119 Type 2 diabetes mellitus without complications: Secondary | ICD-10-CM | POA: Insufficient documentation

## 2018-09-07 DIAGNOSIS — L720 Epidermal cyst: Secondary | ICD-10-CM | POA: Insufficient documentation

## 2018-09-07 DIAGNOSIS — Z7984 Long term (current) use of oral hypoglycemic drugs: Secondary | ICD-10-CM | POA: Diagnosis not present

## 2018-09-07 DIAGNOSIS — L723 Sebaceous cyst: Secondary | ICD-10-CM | POA: Diagnosis not present

## 2018-09-07 DIAGNOSIS — I2510 Atherosclerotic heart disease of native coronary artery without angina pectoris: Secondary | ICD-10-CM | POA: Insufficient documentation

## 2018-09-07 HISTORY — PX: CYST EXCISION: SHX5701

## 2018-09-07 HISTORY — DX: Hypothyroidism, unspecified: E03.9

## 2018-09-07 HISTORY — DX: Sebaceous cyst: L72.3

## 2018-09-07 LAB — POCT I-STAT, CHEM 8
BUN: 12 mg/dL (ref 8–23)
CALCIUM ION: 1.1 mmol/L — AB (ref 1.15–1.40)
CHLORIDE: 95 mmol/L — AB (ref 98–111)
CREATININE: 0.7 mg/dL (ref 0.61–1.24)
Glucose, Bld: 367 mg/dL — ABNORMAL HIGH (ref 70–99)
HCT: 47 % (ref 39.0–52.0)
Hemoglobin: 16 g/dL (ref 13.0–17.0)
Potassium: 4.1 mmol/L (ref 3.5–5.1)
Sodium: 134 mmol/L — ABNORMAL LOW (ref 135–145)
TCO2: 25 mmol/L (ref 22–32)

## 2018-09-07 LAB — GLUCOSE, CAPILLARY
Glucose-Capillary: 321 mg/dL — ABNORMAL HIGH (ref 70–99)
Glucose-Capillary: 253 mg/dL — ABNORMAL HIGH (ref 70–99)
Glucose-Capillary: 267 mg/dL — ABNORMAL HIGH (ref 70–99)

## 2018-09-07 SURGERY — CYST REMOVAL
Anesthesia: Monitor Anesthesia Care | Site: Neck | Laterality: Right

## 2018-09-07 MED ORDER — PROPOFOL 500 MG/50ML IV EMUL
INTRAVENOUS | Status: AC
  Filled 2018-09-07: qty 50

## 2018-09-07 MED ORDER — LIDOCAINE HCL (CARDIAC) PF 100 MG/5ML IV SOSY
PREFILLED_SYRINGE | INTRAVENOUS | Status: DC | PRN
  Administered 2018-09-07: 100 mg via INTRAVENOUS

## 2018-09-07 MED ORDER — BACITRACIN-NEOMYCIN-POLYMYXIN 400-5-5000 EX OINT
TOPICAL_OINTMENT | CUTANEOUS | Status: AC
  Filled 2018-09-07: qty 1

## 2018-09-07 MED ORDER — FENTANYL CITRATE (PF) 100 MCG/2ML IJ SOLN: 25.0000 ug | INTRAMUSCULAR | Status: DC | PRN

## 2018-09-07 MED ORDER — CHLORHEXIDINE GLUCONATE CLOTH 2 % EX PADS: 6.0000 | MEDICATED_PAD | Freq: Once | CUTANEOUS | Status: DC

## 2018-09-07 MED ORDER — BUPIVACAINE HCL (PF) 0.5 % IJ SOLN
INTRAMUSCULAR | Status: AC
  Filled 2018-09-07: qty 30

## 2018-09-07 MED ORDER — PROPOFOL 10 MG/ML IV BOLUS
INTRAVENOUS | Status: DC | PRN
  Administered 2018-09-07: 30 mg via INTRAVENOUS
  Administered 2018-09-07: 120 mg via INTRAVENOUS

## 2018-09-07 MED ORDER — SCOPOLAMINE 1 MG/3DAYS TD PT72: 1.0000 | MEDICATED_PATCH | Freq: Once | TRANSDERMAL | Status: DC | PRN

## 2018-09-07 MED ORDER — BUPIVACAINE LIPOSOME 1.3 % IJ SUSP: 20.0000 mL | Freq: Once | INTRAMUSCULAR | Status: DC

## 2018-09-07 MED ORDER — SODIUM BICARBONATE 4 % IV SOLN
INTRAVENOUS | Status: AC
  Filled 2018-09-07: qty 5

## 2018-09-07 MED ORDER — SODIUM CHLORIDE 0.9 % IV SOLN
INTRAVENOUS | Status: DC | PRN
  Administered 2018-09-07: 50 ug/min via INTRAVENOUS

## 2018-09-07 MED ORDER — GLYCOPYRROLATE PF 0.2 MG/ML IJ SOSY
PREFILLED_SYRINGE | INTRAMUSCULAR | Status: AC
  Filled 2018-09-07: qty 1

## 2018-09-07 MED ORDER — GLYCOPYRROLATE 0.2 MG/ML IJ SOLN
INTRAMUSCULAR | Status: DC | PRN
  Administered 2018-09-07 (×2): 0.1 mg via INTRAVENOUS

## 2018-09-07 MED ORDER — SODIUM CHLORIDE (PF) 0.9 % IJ SOLN
INTRAMUSCULAR | Status: AC
  Filled 2018-09-07: qty 20

## 2018-09-07 MED ORDER — CEFAZOLIN SODIUM-DEXTROSE 2-4 GM/100ML-% IV SOLN
INTRAVENOUS | Status: AC
  Filled 2018-09-07: qty 100

## 2018-09-07 MED ORDER — LACTATED RINGERS IV SOLN: INTRAVENOUS | Status: DC

## 2018-09-07 MED ORDER — CEFAZOLIN SODIUM-DEXTROSE 2-4 GM/100ML-% IV SOLN
2.0000 g | INTRAVENOUS | Status: AC
  Administered 2018-09-07: 2 g via INTRAVENOUS

## 2018-09-07 MED ORDER — HYDROCODONE-ACETAMINOPHEN 5-325 MG PO TABS: 1.0000 | ORAL_TABLET | Freq: Four times a day (QID) | ORAL | 0 refills | Status: DC | PRN

## 2018-09-07 MED ORDER — ACETAMINOPHEN 500 MG PO TABS
1000.0000 mg | ORAL_TABLET | ORAL | Status: AC
  Administered 2018-09-07: 1000 mg via ORAL

## 2018-09-07 MED ORDER — PHENYLEPHRINE 40 MCG/ML (10ML) SYRINGE FOR IV PUSH (FOR BLOOD PRESSURE SUPPORT)
PREFILLED_SYRINGE | INTRAVENOUS | Status: DC | PRN
  Administered 2018-09-07 (×2): 200 ug via INTRAVENOUS
  Administered 2018-09-07: 160 ug via INTRAVENOUS
  Administered 2018-09-07: 200 ug via INTRAVENOUS

## 2018-09-07 MED ORDER — PHENYLEPHRINE 40 MCG/ML (10ML) SYRINGE FOR IV PUSH (FOR BLOOD PRESSURE SUPPORT)
PREFILLED_SYRINGE | INTRAVENOUS | Status: AC
  Filled 2018-09-07: qty 20

## 2018-09-07 MED ORDER — LIDOCAINE-EPINEPHRINE (PF) 1 %-1:200000 IJ SOLN
INTRAMUSCULAR | Status: AC
  Filled 2018-09-07: qty 30

## 2018-09-07 MED ORDER — BACITRACIN-NEOMYCIN-POLYMYXIN 400-5-5000 EX OINT
TOPICAL_OINTMENT | CUTANEOUS | Status: DC | PRN
  Administered 2018-09-07: 1 via TOPICAL

## 2018-09-07 MED ORDER — FENTANYL CITRATE (PF) 100 MCG/2ML IJ SOLN: 50.0000 ug | INTRAMUSCULAR | Status: DC | PRN

## 2018-09-07 MED ORDER — LIDOCAINE 2% (20 MG/ML) 5 ML SYRINGE
INTRAMUSCULAR | Status: AC
  Filled 2018-09-07: qty 5

## 2018-09-07 MED ORDER — ONDANSETRON HCL 4 MG/2ML IJ SOLN
INTRAMUSCULAR | Status: AC
  Filled 2018-09-07: qty 2

## 2018-09-07 MED ORDER — ACETAMINOPHEN 500 MG PO TABS
ORAL_TABLET | ORAL | Status: AC
  Filled 2018-09-07: qty 2

## 2018-09-07 MED ORDER — FENTANYL CITRATE (PF) 100 MCG/2ML IJ SOLN
INTRAMUSCULAR | Status: DC | PRN
  Administered 2018-09-07 (×2): 50 ug via INTRAVENOUS

## 2018-09-07 MED ORDER — BUPIVACAINE-EPINEPHRINE 0.25% -1:200000 IJ SOLN
INTRAMUSCULAR | Status: DC | PRN
  Administered 2018-09-07: 6 mL

## 2018-09-07 MED ORDER — INSULIN ASPART 100 UNIT/ML ~~LOC~~ SOLN
4.0000 [IU] | Freq: Once | SUBCUTANEOUS | Status: AC
  Administered 2018-09-07: 4 [IU] via INTRAVENOUS

## 2018-09-07 MED ORDER — LACTATED RINGERS IV SOLN
INTRAVENOUS | Status: DC | PRN
  Administered 2018-09-07 (×2): via INTRAVENOUS

## 2018-09-07 MED ORDER — BUPIVACAINE-EPINEPHRINE (PF) 0.25% -1:200000 IJ SOLN
INTRAMUSCULAR | Status: AC
  Filled 2018-09-07: qty 30

## 2018-09-07 MED ORDER — FENTANYL CITRATE (PF) 100 MCG/2ML IJ SOLN
INTRAMUSCULAR | Status: AC
  Filled 2018-09-07: qty 2

## 2018-09-07 MED ORDER — ONDANSETRON HCL 4 MG/2ML IJ SOLN
INTRAMUSCULAR | Status: DC | PRN
  Administered 2018-09-07: 4 mg via INTRAVENOUS

## 2018-09-07 MED ORDER — BUPIVACAINE LIPOSOME 1.3 % IJ SUSP
INTRAMUSCULAR | Status: AC
  Filled 2018-09-07: qty 20

## 2018-09-07 MED ORDER — MIDAZOLAM HCL 2 MG/2ML IJ SOLN: 1.0000 mg | INTRAMUSCULAR | Status: DC | PRN

## 2018-09-07 MED ORDER — PHENYLEPHRINE HCL 10 MG/ML IJ SOLN
INTRAMUSCULAR | Status: AC
  Filled 2018-09-07: qty 2

## 2018-09-07 SURGICAL SUPPLY — 45 items
ADH SKN CLS APL DERMABOND .7 (GAUZE/BANDAGES/DRESSINGS)
BENZOIN TINCTURE PRP APPL 2/3 (GAUZE/BANDAGES/DRESSINGS) IMPLANT
BLADE CLIPPER SURG (BLADE) IMPLANT
BLADE SURG 15 STRL LF DISP TIS (BLADE) ×2 IMPLANT
BLADE SURG 15 STRL SS (BLADE) ×4
CANISTER SUCT 1200ML W/VALVE (MISCELLANEOUS) IMPLANT
CLEANER CAUTERY TIP 5X5 PAD (MISCELLANEOUS) ×1 IMPLANT
CLOSURE WOUND 1/2 X4 (GAUZE/BANDAGES/DRESSINGS)
COVER BACK TABLE 60X90IN (DRAPES) ×3 IMPLANT
COVER MAYO STAND STRL (DRAPES) ×3 IMPLANT
COVER WAND RF STERILE (DRAPES) IMPLANT
DECANTER SPIKE VIAL GLASS SM (MISCELLANEOUS) IMPLANT
DERMABOND ADVANCED (GAUZE/BANDAGES/DRESSINGS)
DERMABOND ADVANCED .7 DNX12 (GAUZE/BANDAGES/DRESSINGS) IMPLANT
DRAPE LAPAROTOMY 100X72 PEDS (DRAPES) IMPLANT
DRAPE U-SHAPE 76X120 STRL (DRAPES) ×3 IMPLANT
DRSG EMULSION OIL 3X3 NADH (GAUZE/BANDAGES/DRESSINGS) ×3 IMPLANT
ELECT REM PT RETURN 9FT ADLT (ELECTROSURGICAL) ×3
ELECTRODE REM PT RTRN 9FT ADLT (ELECTROSURGICAL) ×1 IMPLANT
GAUZE SPONGE 4X4 12PLY STRL LF (GAUZE/BANDAGES/DRESSINGS) ×3 IMPLANT
GLOVE BIO SURGEON STRL SZ8 (GLOVE) ×3 IMPLANT
GOWN STRL REUS W/ TWL LRG LVL3 (GOWN DISPOSABLE) ×1 IMPLANT
GOWN STRL REUS W/ TWL XL LVL3 (GOWN DISPOSABLE) ×1 IMPLANT
GOWN STRL REUS W/TWL LRG LVL3 (GOWN DISPOSABLE) ×2
GOWN STRL REUS W/TWL XL LVL3 (GOWN DISPOSABLE) ×2
NEEDLE HYPO 25X1 1.5 SAFETY (NEEDLE) ×3 IMPLANT
NEEDLE PRECISIONGLIDE 27X1.5 (NEEDLE) IMPLANT
NS IRRIG 1000ML POUR BTL (IV SOLUTION) ×3 IMPLANT
PACK BASIN DAY SURGERY FS (CUSTOM PROCEDURE TRAY) ×3 IMPLANT
PAD CLEANER CAUTERY TIP 5X5 (MISCELLANEOUS) ×2
PENCIL BUTTON HOLSTER BLD 10FT (ELECTRODE) ×3 IMPLANT
STRIP CLOSURE SKIN 1/2X4 (GAUZE/BANDAGES/DRESSINGS) IMPLANT
SUT ETHILON 3 0 FSL (SUTURE) ×6 IMPLANT
SUT ETHILON 5 0 PS 2 18 (SUTURE) IMPLANT
SUT VIC AB 4-0 SH 18 (SUTURE) ×3 IMPLANT
SUT VIC AB 5-0 PS2 18 (SUTURE) IMPLANT
SUT VICRYL 3-0 CR8 SH (SUTURE) IMPLANT
SYR BULB 3OZ (MISCELLANEOUS) ×3 IMPLANT
SYR CONTROL 10ML LL (SYRINGE) ×3 IMPLANT
TOWEL GREEN STERILE FF (TOWEL DISPOSABLE) ×3 IMPLANT
TRAY DSU PREP LF (CUSTOM PROCEDURE TRAY) ×3 IMPLANT
TUBE CONNECTING 20'X1/4 (TUBING)
TUBE CONNECTING 20X1/4 (TUBING) IMPLANT
UNDERPAD 30X30 (UNDERPADS AND DIAPERS) IMPLANT
YANKAUER SUCT BULB TIP NO VENT (SUCTIONS) IMPLANT

## 2018-09-07 NOTE — Transfer of Care (Signed)
Immediate Anesthesia Transfer of Care Note  Patient: [name]  Procedure(s) Performed: EXCISION OF RECURRENT SEBACEOUS CYST OF NECK (Right Neck)  Patient Location: PACU  Anesthesia Type:General  Level of Consciousness: awake, alert , oriented and patient cooperative  Airway & Oxygen Therapy: Patient Spontanous Breathing  Post-op Assessment: Report given to RN and Post -op Vital signs reviewed and stable  Post vital signs: Reviewed and stable  Last Vitals:  Vitals Value Taken Time  BP 117/75 09/07/2018  9:05 AM  Temp    Pulse 70 09/07/2018  9:06 AM  Resp 19 09/07/2018  9:07 AM  SpO2 100 % 09/07/2018  9:06 AM  Vitals shown include unvalidated device data.  Last Pain:  Vitals:   09/07/18 0659  TempSrc: Oral  PainSc: 0-No pain      Patients Stated Pain Goal: 3 (75/79/72 8206)  Complications: No apparent anesthesia complications

## 2018-09-07 NOTE — Discharge Instructions (Signed)
Epidermal Cyst Removal, Care After Refer to this sheet in the next few weeks. These instructions provide you with information about caring for yourself after your procedure. Your health care provider may also give you more specific instructions. Your treatment has been planned according to current medical practices, but problems sometimes occur. Call your health care provider if you have any problems or questions after your procedure. What can I expect after the procedure? After the procedure, it is common to have:  Soreness in the area where your cyst was removed.  Tightness or itching from your skin sutures.  Follow these instructions at home:  Take medicines only as directed by your health care provider.  If you were prescribed an antibiotic medicine, finish all of it even if you start to feel better.  Use antibiotic ointment as directed by your health care provider. Follow the instructions carefully.  There are many different ways to close and cover an incision, including stitches (sutures), skin glue, and adhesive strips. Follow your health care provider's instructions about: ? Incision care. ? Bandage (dressing) changes and removal. ? Incision closure removal.  Keep the bandage (dressing) dry until your health care provider says that it can be removed. Take sponge baths only. Ask your health care provider when you can start showering or taking a bath.  After your dressing is off, check your incision every day for signs of infection. Watch for: ? Redness, swelling, or pain. ? Fluid, blood, or pus.  You can return to your normal activities. Do not do anything that stretches or puts pressure on your incision.  You can return to your normal diet.  Keep all follow-up visits as directed by your health care provider. This is important. Contact a health care provider if:  You have a fever.  Your incision bleeds.  You have redness, swelling, or pain in the incision area.  You  have fluid, blood, or pus coming from your incision.  Your cyst comes back after surgery. This information is not intended to replace advice given to you by your health care provider. Make sure you discuss any questions you have with your health care provider. Document Released: 10/31/2014 Document Revised: 03/17/2016 Document Reviewed: 06/25/2014 Elsevier Interactive Patient Education  2018 [place] [facility] Home Care Instructions  Activity: Get plenty of rest for the remainder of the day. A responsible individual must stay with you for 24 hours following the procedure.  For the next 24 hours, DO NOT: -Drive a car -Paediatric nurse -Drink alcoholic beverages -Take any medication unless instructed by your physician -Make any legal decisions or sign important papers.  Meals: Start with liquid foods such as gelatin or soup. Progress to regular foods as tolerated. Avoid greasy, spicy, heavy foods. If nausea and/or vomiting occur, drink only clear liquids until the nausea and/or vomiting subsides. Call your physician if vomiting continues.  Special Instructions/Symptoms: Your throat may feel dry or sore from the anesthesia or the breathing tube placed in your throat during surgery. If this causes discomfort, gargle with warm salt water. The discomfort should disappear within 24 hours.  If you had a scopolamine patch placed behind your ear for the management of post- operative nausea and/or vomiting:  1. The medication in the patch is effective for 72 hours, after which it should be removed.  Wrap patch in a tissue and discard in the trash. Wash hands thoroughly with soap and water. 2. You may remove the patch earlier than 72  hours if you experience unpleasant side effects which may include dry mouth, dizziness or visual disturbances. 3. Avoid touching the patch. Wash your hands with soap and water after contact with the patch.

## 2018-09-07 NOTE — Interval H&P Note (Signed)
History and Physical Interval Note:  09/07/2018 7:29 AM  [name]  has presented today for surgery, with the diagnosis of SEBACEOUS CYST  The various methods of treatment have been discussed with the patient and family. After consideration of risks, benefits and other options for treatment, the patient has consented to  Procedure(s): EXCISION OF RECURRENT SEBACEOUS CYST OF NECK (N/A) as a surgical intervention .  The patient's history has been reviewed, patient examined, no change in status, stable for surgery.  I have reviewed the patient's chart and labs.  Questions were answered to the patient's satisfaction.     Pedro Earls

## 2018-09-07 NOTE — Op Note (Signed)
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  After anesthesia was administered and the patient was prepped  with Technicare and a timeout was performed.  An ellipse of skin was described after injecting with marcaine/epi.  These flaps were developed staying out of the cyst for the most part.  There were two areas that extruded a bit of material but this was after the cyst/scar tissued had been divided.  The cyst was 2 cm in diameter and was densely stuck to the old scar.  A clean excision was felt present;  Hemostasis with Bovie.  Irrigated and closed with 4-0 vicryl sub cut and with 3-0 vertical mattresses of nylon.    The patient tolerated the procedure well and was taken to the PACU in stable condition.     Matt B. Hassell Done, [place], [facility], [place]

## 2018-09-07 NOTE — Anesthesia Postprocedure Evaluation (Signed)
Anesthesia Post Note  Patient: [name]  Procedure(s) Performed: EXCISION OF RECURRENT SEBACEOUS CYST OF NECK (Right Neck)     Patient location during evaluation: PACU Anesthesia Type: MAC Level of consciousness: awake and alert Pain management: pain level controlled Vital Signs Assessment: post-procedure vital signs reviewed and stable Respiratory status: spontaneous breathing, nonlabored ventilation, respiratory function stable and patient connected to nasal cannula oxygen Cardiovascular status: blood pressure returned to baseline and stable Postop Assessment: no apparent nausea or vomiting Anesthetic complications: no    Last Vitals:  Vitals:   09/07/18 0907 09/07/18 0942  BP:  122/83  Pulse:  82  Resp: 19 18  Temp:  36.6 C  SpO2:  98%    Last Pain:  Vitals:   09/07/18 0942  TempSrc:   PainSc: 0-No pain   Pain Goal: Patients Stated Pain Goal: 3 (09/07/18 0659)               Haywood Lasso L Doloris Servantes

## 2018-09-07 NOTE — Anesthesia Procedure Notes (Signed)
Procedure Name: LMA Insertion Date/Time: 09/07/2018 7:58 AM Performed by: Raenette Rover, CRNA Pre-anesthesia Checklist: Patient identified, Emergency Drugs available, Suction available and Patient being monitored Patient Re-evaluated:Patient Re-evaluated prior to induction Oxygen Delivery Method: Circle system utilized Preoxygenation: Pre-oxygenation with 100% oxygen Induction Type: IV induction LMA: LMA inserted LMA Size: 4.0 Number of attempts: 2 Placement Confirmation: positive ETCO2,  CO2 detector and breath sounds checked- equal and bilateral Tube secured with: Tape Dental Injury: Teeth and Oropharynx as per pre-operative assessment

## 2018-09-07 NOTE — Anesthesia Preprocedure Evaluation (Addendum)
Anesthesia Evaluation  Patient identified by MRN, date of birth, ID band Patient awake    Reviewed: Allergy & Precautions, NPO status , Patient's Chart, lab work & pertinent test results  Airway Mallampati: I  TM Distance: >3 FB Neck ROM: Full    Dental no notable dental hx. (+) Edentulous Upper, Edentulous Lower   Pulmonary neg pulmonary ROS,    Pulmonary exam normal breath sounds clear to auscultation       Cardiovascular + CAD  Normal cardiovascular exam Rhythm:Regular Rate:Normal  2004 LHC Tandem 50/60% lesions in the LAD a 20% lesion the RCA       Neuro/Psych negative neurological ROS  negative psych ROS   GI/Hepatic Neg liver ROS, GERD  ,  Endo/Other  diabetes, Type 2, Oral Hypoglycemic AgentsHypothyroidism   Renal/GU negative Renal ROS  negative genitourinary   Musculoskeletal  (+) Arthritis ,   Abdominal   Peds  Hematology negative hematology ROS (+)   Anesthesia Other Findings Recurrent sebaceous cyst neck  Reproductive/Obstetrics                           Anesthesia Physical Anesthesia Plan  ASA: III  Anesthesia Plan: General   Post-op Pain Management:    Induction: Intravenous  PONV Risk Score and Plan: 1 and Propofol infusion, Treatment may vary due to age or medical condition and Midazolam  Airway Management Planned: LMA  Additional Equipment:   Intra-op Plan:   Post-operative Plan:   Informed Consent: I have reviewed the patients History and Physical, chart, labs and discussed the procedure including the risks, benefits and alternatives for the proposed anesthesia with the patient or authorized representative who has indicated his/her understanding and acceptance.   Dental advisory given  Plan Discussed with: CRNA  Anesthesia Plan Comments: (Blood sugar 321 this morning. Pt did not take prescribed metformin. He did drink Ensure as instructed, which is likely  contributing to elevated blood glucose. Pt asymptomatic. BMP on 09/04/18 with normal K and normal anion gap. Plan to treat with IV insulin, and recheck blood glucose. )      Anesthesia Quick Evaluation

## 2018-09-10 ENCOUNTER — Encounter (HOSPITAL_BASED_OUTPATIENT_CLINIC_OR_DEPARTMENT_OTHER): Payer: Self-pay | Admitting: Surgery

## 2018-10-20 ENCOUNTER — Other Ambulatory Visit: Payer: Self-pay

## 2018-10-20 ENCOUNTER — Encounter (HOSPITAL_COMMUNITY): Payer: Self-pay

## 2018-10-20 ENCOUNTER — Ambulatory Visit (INDEPENDENT_AMBULATORY_CARE_PROVIDER_SITE_OTHER)
Admission: EM | Admit: 2018-10-20 | Discharge: 2018-10-20 | Disposition: A | Discharge status: Home / Self Care | Payer: Medicare Other | Source: Home / Self Care | Attending: Family Medicine | Admitting: Family Medicine

## 2018-10-20 ENCOUNTER — Inpatient Hospital Stay (HOSPITAL_COMMUNITY)
Admission: EM | Admit: 2018-10-20 | Discharge: 2018-10-23 | DRG: 872 | Disposition: A | Discharge status: Home / Self Care | Payer: Medicare Other | Attending: Family Medicine | Admitting: Family Medicine

## 2018-10-20 ENCOUNTER — Emergency Department (HOSPITAL_COMMUNITY): Payer: Medicare Other

## 2018-10-20 DIAGNOSIS — R739 Hyperglycemia, unspecified: Secondary | ICD-10-CM | POA: Diagnosis not present

## 2018-10-20 DIAGNOSIS — N400 Enlarged prostate without lower urinary tract symptoms: Secondary | ICD-10-CM | POA: Diagnosis not present

## 2018-10-20 DIAGNOSIS — R310 Gross hematuria: Secondary | ICD-10-CM

## 2018-10-20 DIAGNOSIS — E1165 Type 2 diabetes mellitus with hyperglycemia: Secondary | ICD-10-CM | POA: Diagnosis present

## 2018-10-20 DIAGNOSIS — R81 Glycosuria: Secondary | ICD-10-CM | POA: Insufficient documentation

## 2018-10-20 DIAGNOSIS — E785 Hyperlipidemia, unspecified: Secondary | ICD-10-CM | POA: Insufficient documentation

## 2018-10-20 DIAGNOSIS — R51 Headache: Secondary | ICD-10-CM | POA: Diagnosis present

## 2018-10-20 DIAGNOSIS — E039 Hypothyroidism, unspecified: Secondary | ICD-10-CM

## 2018-10-20 DIAGNOSIS — Z79890 Hormone replacement therapy (postmenopausal): Secondary | ICD-10-CM

## 2018-10-20 DIAGNOSIS — I2510 Atherosclerotic heart disease of native coronary artery without angina pectoris: Secondary | ICD-10-CM | POA: Insufficient documentation

## 2018-10-20 DIAGNOSIS — E119 Type 2 diabetes mellitus without complications: Secondary | ICD-10-CM | POA: Diagnosis not present

## 2018-10-20 DIAGNOSIS — R300 Dysuria: Secondary | ICD-10-CM | POA: Insufficient documentation

## 2018-10-20 DIAGNOSIS — Z79899 Other long term (current) drug therapy: Secondary | ICD-10-CM

## 2018-10-20 DIAGNOSIS — Z8249 Family history of ischemic heart disease and other diseases of the circulatory system: Secondary | ICD-10-CM

## 2018-10-20 DIAGNOSIS — A4153 Sepsis due to Serratia: Secondary | ICD-10-CM | POA: Diagnosis not present

## 2018-10-20 DIAGNOSIS — N281 Cyst of kidney, acquired: Secondary | ICD-10-CM | POA: Diagnosis not present

## 2018-10-20 DIAGNOSIS — R000 Tachycardia, unspecified: Secondary | ICD-10-CM | POA: Insufficient documentation

## 2018-10-20 DIAGNOSIS — A419 Sepsis, unspecified organism: Secondary | ICD-10-CM

## 2018-10-20 DIAGNOSIS — Z833 Family history of diabetes mellitus: Secondary | ICD-10-CM

## 2018-10-20 DIAGNOSIS — R0981 Nasal congestion: Secondary | ICD-10-CM | POA: Diagnosis present

## 2018-10-20 DIAGNOSIS — Z7984 Long term (current) use of oral hypoglycemic drugs: Secondary | ICD-10-CM | POA: Diagnosis not present

## 2018-10-20 DIAGNOSIS — E876 Hypokalemia: Secondary | ICD-10-CM | POA: Diagnosis not present

## 2018-10-20 DIAGNOSIS — N3001 Acute cystitis with hematuria: Secondary | ICD-10-CM | POA: Diagnosis not present

## 2018-10-20 DIAGNOSIS — R109 Unspecified abdominal pain: Secondary | ICD-10-CM | POA: Diagnosis not present

## 2018-10-20 HISTORY — DX: Type 2 diabetes mellitus without complications: E11.9

## 2018-10-20 HISTORY — DX: Tinnitus, right ear: H93.11

## 2018-10-20 HISTORY — DX: Personal history of urinary calculi: Z87.442

## 2018-10-20 LAB — POCT I-STAT, CHEM 8
BUN: 12 mg/dL (ref 8–23)
Calcium, Ion: 1.1 mmol/L — ABNORMAL LOW (ref 1.15–1.40)
Chloride: 94 mmol/L — ABNORMAL LOW (ref 98–111)
Creatinine, Ser: 0.8 mg/dL (ref 0.61–1.24)
GLUCOSE: 324 mg/dL — AB (ref 70–99)
HCT: 48 % (ref 39.0–52.0)
HEMOGLOBIN: 16.3 g/dL (ref 13.0–17.0)
POTASSIUM: 4.2 mmol/L (ref 3.5–5.1)
Sodium: 130 mmol/L — ABNORMAL LOW (ref 135–145)
TCO2: 25 mmol/L (ref 22–32)

## 2018-10-20 LAB — I-STAT CG4 LACTIC ACID, ED: Lactic Acid, Venous: 2.13 mmol/L (ref 0.5–1.9)

## 2018-10-20 LAB — URINALYSIS, ROUTINE W REFLEX MICROSCOPIC
Bilirubin Urine: NEGATIVE
Ketones, ur: 20 mg/dL — AB
NITRITE: NEGATIVE
PH: 5 (ref 5.0–8.0)
PROTEIN: 100 mg/dL — AB
RBC / HPF: >50 RBC/hpf — ABNORMAL HIGH (ref 0–5)
Specific Gravity, Urine: 1.031 — ABNORMAL HIGH (ref 1.005–1.030)

## 2018-10-20 LAB — POCT URINALYSIS DIP (DEVICE)
Bilirubin Urine: NEGATIVE
Glucose, UA: >=1000 mg/dL — AB
Ketones, ur: NEGATIVE mg/dL
NITRITE: NEGATIVE
PH: 5.5 (ref 5.0–8.0)
Protein, ur: 30 mg/dL — AB
Specific Gravity, Urine: 1.025 (ref 1.005–1.030)
UROBILINOGEN UA: 1 mg/dL (ref 0.0–1.0)

## 2018-10-20 LAB — CBC
HEMATOCRIT: 45 % (ref 39.0–52.0)
HEMOGLOBIN: 15.3 g/dL (ref 13.0–17.0)
MCH: 29.1 pg (ref 26.0–34.0)
MCHC: 34 g/dL (ref 30.0–36.0)
MCV: 85.6 fL (ref 80.0–100.0)
NRBC: 0 % (ref 0.0–0.2)
Platelets: 214 10*3/uL (ref 150–400)
RBC: 5.26 MIL/uL (ref 4.22–5.81)
RDW: 13.7 % (ref 11.5–15.5)
WBC: 27.2 10*3/uL — AB (ref 4.0–10.5)

## 2018-10-20 LAB — COMPREHENSIVE METABOLIC PANEL
ALT: 34 U/L (ref 0–44)
ANION GAP: 13 (ref 5–15)
AST: 34 U/L (ref 15–41)
Albumin: 3.8 g/dL (ref 3.5–5.0)
Alkaline Phosphatase: 75 U/L (ref 38–126)
BUN: 12 mg/dL (ref 8–23)
CHLORIDE: 94 mmol/L — AB (ref 98–111)
CO2: 24 mmol/L (ref 22–32)
Calcium: 8.9 mg/dL (ref 8.9–10.3)
Creatinine, Ser: 1.14 mg/dL (ref 0.61–1.24)
Glucose, Bld: 321 mg/dL — ABNORMAL HIGH (ref 70–99)
Potassium: 3.7 mmol/L (ref 3.5–5.1)
Sodium: 131 mmol/L — ABNORMAL LOW (ref 135–145)
Total Bilirubin: 2.3 mg/dL — ABNORMAL HIGH (ref 0.3–1.2)
Total Protein: 7.1 g/dL (ref 6.5–8.1)

## 2018-10-20 LAB — LIPASE, BLOOD: LIPASE: 24 U/L (ref 11–51)

## 2018-10-20 MED ORDER — SODIUM CHLORIDE 0.9 % IV SOLN
1.0000 g | INTRAVENOUS | Status: DC
  Administered 2018-10-21 – 2018-10-22 (×2): 1 g via INTRAVENOUS
  Filled 2018-10-20 (×4): qty 10

## 2018-10-20 MED ORDER — METOCLOPRAMIDE HCL 5 MG/ML IJ SOLN
10.0000 mg | Freq: Once | INTRAMUSCULAR | Status: AC
  Administered 2018-10-20: 10 mg via INTRAVENOUS
  Filled 2018-10-20: qty 2

## 2018-10-20 MED ORDER — FENTANYL CITRATE (PF) 100 MCG/2ML IJ SOLN
50.0000 ug | Freq: Once | INTRAMUSCULAR | Status: AC
  Administered 2018-10-20: 50 ug via INTRAVENOUS
  Filled 2018-10-20: qty 2

## 2018-10-20 MED ORDER — SODIUM CHLORIDE 0.9 % IV BOLUS
1000.0000 mL | Freq: Once | INTRAVENOUS | Status: AC
  Administered 2018-10-20: 1000 mL via INTRAVENOUS

## 2018-10-20 MED ORDER — ACETAMINOPHEN 325 MG PO TABS
650.0000 mg | ORAL_TABLET | Freq: Four times a day (QID) | ORAL | Status: DC | PRN
  Administered 2018-10-21 – 2018-10-22 (×4): 650 mg via ORAL
  Filled 2018-10-20 (×4): qty 2

## 2018-10-20 MED ORDER — HYDROCODONE-ACETAMINOPHEN 5-325 MG PO TABS: 1.0000 | ORAL_TABLET | ORAL | Status: DC | PRN

## 2018-10-20 MED ORDER — SODIUM CHLORIDE 0.9 % IV SOLN
INTRAVENOUS | Status: DC
  Administered 2018-10-21 – 2018-10-22 (×4): via INTRAVENOUS

## 2018-10-20 MED ORDER — ONDANSETRON HCL 4 MG/2ML IJ SOLN: 4.0000 mg | Freq: Four times a day (QID) | INTRAMUSCULAR | Status: DC | PRN

## 2018-10-20 MED ORDER — INSULIN ASPART 100 UNIT/ML ~~LOC~~ SOLN
0.0000 [IU] | SUBCUTANEOUS | Status: DC
  Administered 2018-10-21: 7 [IU] via SUBCUTANEOUS
  Administered 2018-10-21: 5 [IU] via SUBCUTANEOUS
  Administered 2018-10-21 (×2): 7 [IU] via SUBCUTANEOUS
  Administered 2018-10-21: 5 [IU] via SUBCUTANEOUS
  Administered 2018-10-21: 3 [IU] via SUBCUTANEOUS
  Administered 2018-10-22: 5 [IU] via SUBCUTANEOUS
  Administered 2018-10-22: 2 [IU] via SUBCUTANEOUS
  Administered 2018-10-22: 3 [IU] via SUBCUTANEOUS

## 2018-10-20 MED ORDER — ONDANSETRON HCL 4 MG PO TABS: 4.0000 mg | ORAL_TABLET | Freq: Four times a day (QID) | ORAL | Status: DC | PRN

## 2018-10-20 MED ORDER — SODIUM CHLORIDE 0.9 % IV SOLN
1.0000 g | Freq: Once | INTRAVENOUS | Status: AC
  Administered 2018-10-20: 1 g via INTRAVENOUS
  Filled 2018-10-20: qty 10

## 2018-10-20 MED ORDER — IOHEXOL 300 MG/ML  SOLN
100.0000 mL | Freq: Once | INTRAMUSCULAR | Status: AC | PRN
  Administered 2018-10-20: 100 mL via INTRAVENOUS

## 2018-10-20 MED ORDER — ACETAMINOPHEN 650 MG RE SUPP: 650.0000 mg | Freq: Four times a day (QID) | RECTAL | Status: DC | PRN

## 2018-10-20 MED ORDER — ONDANSETRON 4 MG PO TBDP: 4.0000 mg | ORAL_TABLET | Freq: Once | ORAL | Status: DC | PRN

## 2018-10-20 NOTE — ED Provider Notes (Signed)
[place]    CSN: [id number] Arrival date & time: 10/20/18  1657     History   Chief Complaint No chief complaint on file.   HPI [name] is a 67 y.o. male.   The history is provided by the patient. No language interpreter was used.  Dysuria  This is a new problem. Episode onset: started 2 days ago. The problem occurs constantly. The problem has been gradually worsening (pain with urination everytime he goes). Associated symptoms include abdominal pain. Pertinent negatives include no chest pain and no shortness of breath. Associated symptoms comments: Associated with suprapubic pain which actually started 3 weeks prior to his dysuria. Appetite has been low due to painful urination. Exacerbated by: urinating. Relieved by: Not urinating. He has tried acetaminophen for the symptoms. The treatment provided no relief.  Tachycardia: He denies chest pain, no SOB. He is in pain.  Past Medical History:  Diagnosis Date  . Arthritis   . Coronary artery disease    tandem 50/60% lesions in the LAD a 20% lesion the RCA catheterization 2004  . Degenerative disc disease   . Diabetes mellitus without complication ([place])   . Glucose intolerance (impaired glucose tolerance)   . HLD (hyperlipidemia)   . Hypothyroidism   . Impaired hearing   . Sebaceous cyst    neck  . Thyroid disease    not on meds  . Tinnitus   . Varicose veins     Patient Active Problem List   Diagnosis Date Noted  . Class 1 obesity due to excess calories with body mass index (BMI) of 33.0 to 33.9 in adult 09/09/2016  . Pre-operative cardiovascular examination 06/16/2011  . Coronary artery disease-nonobstructive 50% lesions tandem and the LAD catheterization 2004 ejection fraction-50% 06/16/2011  . FATTY LIVER DISEASE 08/17/2010  . ARTHRITIS 08/17/2010  . FECAL OCCULT BLOOD 08/17/2010  . PANCREATITIS 08/13/2010  . HYPOTHYROIDISM 08/12/2010  . Diabetes mellitus ([place]) 08/12/2010  .  HYPERCHOLESTEROLEMIA 08/12/2010  . GERD 08/12/2010  . HIATAL HERNIA 08/12/2010  . HEMOCCULT POSITIVE STOOL 08/12/2010    Past Surgical History:  Procedure Laterality Date  . CYST EXCISION Right 09/07/2018   Procedure: EXCISION OF RECURRENT SEBACEOUS CYST OF NECK;  Surgeon: Johnathan Hausen, MD;  Location: [facility];  Service: General;  Laterality: Right;  posterior  . JOINT REPLACEMENT    . KNEE SURGERY     both knees  . MOUTH SURGERY     all teeth removed at age 85  . repair peroneus tendon tear    . SHOULDER SURGERY     both shoulders       Home Medications    Prior to Admission medications   Medication Sig Start Date End Date Taking? Authorizing Provider  Blood Glucose Monitoring Suppl (FIFTY50 GLUCOSE METER 2.0) w/Device KIT Please check blood sugars once daily due to fluctuating blood sugars. 09/09/16   [provider]  HYDROcodone-acetaminophen (NORCO/VICODIN) 5-325 MG tablet Take 1 tablet by mouth every 6 (six) hours as needed for moderate pain. 09/07/18   Johnathan Hausen, MD  levothyroxine (SYNTHROID, LEVOTHROID) 50 MCG tablet Take by mouth. 09/09/16   [provider]  metFORMIN (GLUCOPHAGE-XR) 500 MG 24 hr tablet Take 500 mg by mouth. 09/09/16   [provider]  simvastatin (ZOCOR) 5 MG tablet Take 5 mg by mouth. 09/09/16   [provider]    Family History Family History  Problem Relation Age of Onset  . Heart attack Brother  triple bypass  . Diabetes Brother   . Heart block Brother   . Diabetes Brother   . Diabetes Mother   . Diabetes Father   . Diabetes Unknown        siblings  . Heart defect Unknown        both parents    Social History Social History   Tobacco Use  . Smoking status: Never Smoker  . Smokeless tobacco: Never Used  Substance Use Topics  . Alcohol use: No  . Drug use: No     Allergies   Patient has no known allergies.   Review of Systems Review of Systems    Constitutional: Negative.   Respiratory: Negative.  Negative for shortness of breath.   Cardiovascular: Negative.  Negative for chest pain and palpitations.  Gastrointestinal: Positive for abdominal pain.  Genitourinary: Positive for difficulty urinating, dysuria and hematuria. Negative for decreased urine volume.  All other systems reviewed and are negative.    Physical Exam Triage Vital Signs ED Triage Vitals  Enc Vitals Group     BP      Pulse      Resp      Temp      Temp src      SpO2      Weight      Height      Head Circumference      Peak Flow      Pain Score      Pain Loc      Pain Edu?      Excl. in [place]?    No data found.  Updated Vital Signs There were no vitals taken for this visit.  Visual Acuity Right Eye Distance:   Left Eye Distance:   Bilateral Distance:    Right Eye Near:   Left Eye Near:    Bilateral Near:     Physical Exam Vitals signs and nursing note reviewed.  Constitutional:      General: He is not in acute distress.    Appearance: He is not toxic-appearing.  Cardiovascular:     Rate and Rhythm: Normal rate and regular rhythm.     Pulses: Normal pulses.     Heart sounds: No murmur.  Pulmonary:     Effort: Pulmonary effort is normal. No respiratory distress.     Breath sounds: No wheezing or rhonchi.  Abdominal:     General: Bowel sounds are normal. There is no distension.     Tenderness: There is no abdominal tenderness. There is no right CVA tenderness, left CVA tenderness, guarding or rebound.  Neurological:     Mental Status: He is alert.    HR measured by me: 119, 122, 121, 118  UC Treatments / Results  Labs (all labs ordered are listed, but only abnormal results are displayed) Labs Reviewed - No data to display  EKG None  Radiology No results found.  Procedures Procedures (including critical care time)  Medications Ordered in UC Medications - No data to display  Initial Impression / Assessment and Plan / UC  Course  I have reviewed the triage vital signs and the nursing notes.  Pertinent labs & imaging results that were available during my care of the patient were reviewed by me and considered in my medical decision making (see chart for details).  Clinical Course as of Oct 21 1919  Sat Oct 20, 2018  1903 Dysuria UA clean for infection but shows large blood. ?? Bladder cancer. I went ahead  and send it his urine for culture. I will treat with A/B while awaiting culture despite neg nitrite and leukocyte. Since she is having abdominal pain ongoing for 3 weeks with large blood in the urine, I recommended ED visit for further evaluation. He and his daughter agreed with the plan. He is stable for self transfer to the ED. I- Stat completed but was pending prior to ED transfer. To f/u result at the ED.   [KE]  1905 EKG looks fine. Sinus tachycardia @ 120 bmp, non-specific ST and T wave changes. Similar to the previous EKG except for increased HR. HR increased likely due to pain. He denies chest pain. He is going to the ED any ways for stomach pain. Will need further cardiac eval.   [KE]  1917 Glucosuria. Hx diabetes. Complaint with metformin. I Stat pending at this time. Will reevaluate at the ED.   [KE]    Clinical Course User Index [KE] Kinnie Feil, MD    Hematuria, gross  Tachycardia  Dysuria  Glucosuria  More than 50% of this 45 min encounter was spent or diagnostic evaluate, counseling and coordination of care. Final Clinical Impressions(s) / UC Diagnoses   Final diagnoses:  None   Discharge Instructions   None    ED Prescriptions    None     Controlled Substance Prescriptions [place] Controlled Substance Registry consulted? Not Applicable   Kinnie Feil, MD 10/20/18 Lurena Nida

## 2018-10-20 NOTE — Discharge Instructions (Signed)
I worry about your stomach pain and blood in the urine. I will prefer you get a stomach imaging today to further evaluate your symptoms. Your heart rate is high although EKG looks fine. This could be paroxysmal afib. Please go to the ED for further evaluation.

## 2018-10-20 NOTE — H&P (Signed)
History and Physical    [name]:[id number] DOB: January 21, 1951 DOA: 10/20/2018  PCP: Patient, No Pcp Per  Patient coming from: Home  I have personally briefly reviewed patient's old medical records in [place]  Chief Complaint: Abd pain, gross hematuria  HPI: [name] is a 67 y.o. male with medical history significant of CAD, DM2, hypothyroidism not taking meds for past 5 years or so.  Patient presents to the ED with 3 weeks of dysuria and hematuria.  Has shaking chills at home but hasnt measured temp.  Symptoms much worse over past 2-3 days.  No back nor abd pain.  Nausea but no vomiting, no diarrhea.  Severe pain at times with voiding.   ED Course: UTI with hematuria.  CT shows findings c/w hemorrhagic cystitis.  Also shows enlarged prostate with heterogenous appearance and recs PSA.   Review of Systems: As per HPI otherwise 10 point review of systems negative.   Past Medical History:  Diagnosis Date  . Arthritis   . Coronary artery disease    tandem 50/60% lesions in the LAD a 20% lesion the RCA catheterization 2004  . Degenerative disc disease   . Diabetes mellitus without complication ([place])   . Glucose intolerance (impaired glucose tolerance)   . HLD (hyperlipidemia)   . Hypothyroidism   . Impaired hearing   . Sebaceous cyst    neck  . Thyroid disease    not on meds  . Tinnitus   . Varicose veins     Past Surgical History:  Procedure Laterality Date  . CYST EXCISION Right 09/07/2018   Procedure: EXCISION OF RECURRENT SEBACEOUS CYST OF NECK;  Surgeon: Johnathan Hausen, MD;  Location: [facility];  Service: General;  Laterality: Right;  posterior  . JOINT REPLACEMENT    . KNEE SURGERY     both knees  . MOUTH SURGERY     all teeth removed at age 31  . repair peroneus tendon tear    . SHOULDER SURGERY     both shoulders     reports that he has never smoked. He has never used smokeless tobacco. He reports that he does not drink  alcohol or use drugs.  No Known Allergies  Family History  Problem Relation Age of Onset  . Heart attack Brother        triple bypass  . Diabetes Brother   . Heart block Brother   . Diabetes Brother   . Diabetes Mother   . Diabetes Father   . Diabetes Other        siblings  . Heart defect Other        both parents     Prior to Admission medications   Medication Sig Start Date End Date Taking? Authorizing Provider  Blood Glucose Monitoring Suppl (FIFTY50 GLUCOSE METER 2.0) w/Device KIT Please check blood sugars once daily due to fluctuating blood sugars. 09/09/16   [provider]  HYDROcodone-acetaminophen (NORCO/VICODIN) 5-325 MG tablet Take 1 tablet by mouth every 6 (six) hours as needed for moderate pain. Patient not taking: Reported on 10/20/2018 09/07/18   Johnathan Hausen, MD    Physical Exam: Vitals:   10/20/18 2218 10/20/18 2219 10/20/18 2245 10/20/18 2300  BP: (!) 123/93  134/80 (!) 123/91  Pulse: (!) 107 (!) 106 (!) 105 (!) 101  Resp:  15 (!) 25 (!) 25  Temp:      TempSrc:      SpO2: 98% 96% 97% 92%  Weight:      Height:        Constitutional: NAD, calm, comfortable Eyes: PERRL, lids and conjunctivae normal ENMT: Mucous membranes are moist. Posterior pharynx clear of any exudate or lesions.Normal dentition.  Neck: normal, supple, no masses, no thyromegaly Respiratory: clear to auscultation bilaterally, no wheezing, no crackles. Normal respiratory effort. No accessory muscle use.  Cardiovascular: Regular rate and rhythm, no murmurs / rubs / gallops. No extremity edema. 2+ pedal pulses. No carotid bruits.  Abdomen: no tenderness, no masses palpated. No hepatosplenomegaly. Bowel sounds positive.  Musculoskeletal: no clubbing / cyanosis. No joint deformity upper and lower extremities. Good ROM, no contractures. Normal muscle tone.  Skin: no rashes, lesions, ulcers. No induration Neurologic: CN 2-12 grossly intact. Sensation intact, DTR normal. Strength  5/5 in all 4.  Psychiatric: Normal judgment and insight. Alert and oriented x 3. Normal mood.    Labs on Admission: I have personally reviewed following labs and imaging studies  CBC: Recent Labs  Lab 10/20/18 1913 10/20/18 2002  WBC  --  27.2*  HGB 16.3 15.3  HCT 48.0 45.0  MCV  --  85.6  PLT  --  568   Basic Metabolic Panel: Recent Labs  Lab 10/20/18 1913 10/20/18 2002  NA 130* 131*  K 4.2 3.7  CL 94* 94*  CO2  --  24  GLUCOSE 324* 321*  BUN 12 12  CREATININE 0.80 1.14  CALCIUM  --  8.9   GFR: Estimated Creatinine Clearance: 76.3 mL/min (by C-G formula based on SCr of 1.14 mg/dL). Liver Function Tests: Recent Labs  Lab 10/20/18 2002  AST 34  ALT 34  ALKPHOS 75  BILITOT 2.3*  PROT 7.1  ALBUMIN 3.8   Recent Labs  Lab 10/20/18 2002  LIPASE 24   No results for input(s): AMMONIA in the last 168 hours. Coagulation Profile: No results for input(s): INR, PROTIME in the last 168 hours. Cardiac Enzymes: No results for input(s): CKTOTAL, CKMB, CKMBINDEX, TROPONINI in the last 168 hours. BNP (last 3 results) No results for input(s): PROBNP in the last 8760 hours. HbA1C: No results for input(s): HGBA1C in the last 72 hours. CBG: No results for input(s): GLUCAP in the last 168 hours. Lipid Profile: No results for input(s): CHOL, HDL, LDLCALC, TRIG, CHOLHDL, LDLDIRECT in the last 72 hours. Thyroid Function Tests: No results for input(s): TSH, T4TOTAL, FREET4, T3FREE, THYROIDAB in the last 72 hours. Anemia Panel: No results for input(s): VITAMINB12, FOLATE, FERRITIN, TIBC, IRON, RETICCTPCT in the last 72 hours. Urine analysis:    Component Value Date/Time   COLORURINE AMBER (A) 10/20/2018 2009   APPEARANCEUR CLOUDY (A) 10/20/2018 2009   LABSPEC 1.031 (H) 10/20/2018 2009   PHURINE 5.0 10/20/2018 2009   GLUCOSEU >=500 (A) 10/20/2018 2009   HGBUR LARGE (A) 10/20/2018 2009   BILIRUBINUR NEGATIVE 10/20/2018 2009   BILIRUBINUR neg 08/30/2014 0925   KETONESUR  20 (A) 10/20/2018 2009   PROTEINUR 100 (A) 10/20/2018 2009   UROBILINOGEN 1.0 10/20/2018 1847   NITRITE NEGATIVE 10/20/2018 2009   LEUKOCYTESUR LARGE (A) 10/20/2018 2009    Radiological Exams on Admission: Ct Abdomen Pelvis W Contrast  Result Date: 10/20/2018 CLINICAL DATA:  Acute onset of suprapubic abdominal pain, dysuria and hematuria. EXAM: CT ABDOMEN AND PELVIS WITH CONTRAST TECHNIQUE: Multidetector CT imaging of the abdomen and pelvis was performed using the standard protocol following bolus administration of intravenous contrast. CONTRAST:  134m OMNIPAQUE IOHEXOL 300 MG/ML  SOLN COMPARISON:  CT of the abdomen and  pelvis performed 04/14/2008, and abdominal ultrasound performed 04/15/2008 FINDINGS: Lower chest: The visualized lung bases are grossly clear. The visualized portions of the mediastinum are unremarkable. Hepatobiliary: A calcified granuloma is noted within the liver. The gallbladder is unremarkable in appearance. The common bile duct is normal in caliber. Pancreas: The pancreas is within normal limits. Spleen: The spleen is unremarkable in appearance. Adrenals/Urinary Tract: The adrenal glands are unremarkable in appearance. Small right renal cysts are noted. Mild nonspecific perinephric stranding is noted bilaterally. There is no evidence of hydronephrosis. No renal or ureteral stones are identified. No definite filling defects are seen on delayed images. Stomach/Bowel: The stomach is unremarkable in appearance. The small bowel is within normal limits. The appendix is normal in caliber, without evidence of appendicitis. The colon is unremarkable in appearance. Vascular/Lymphatic: The abdominal aorta is unremarkable in appearance. The inferior vena cava is grossly unremarkable. No retroperitoneal lymphadenopathy is seen. No pelvic sidewall lymphadenopathy is identified. Reproductive: The bladder is mildly distended. Mild soft tissue inflammation about the bladder could reflect cystitis.  High attenuation within the bladder may reflect clinically described hematuria. The prostate is enlarged, measuring 5.5 cm in transverse dimension, with scattered calcification and heterogeneity. Other: No additional soft tissue abnormalities are seen. Musculoskeletal: No acute osseous abnormalities are identified. The visualized musculature is unremarkable in appearance. IMPRESSION: 1. Mild soft tissue inflammation about the bladder could reflect cystitis. High attenuation within the bladder may reflect clinically described hematuria. 2. Enlarged prostate, with scattered calcification and heterogeneity. Would correlate with PSA. 3. Small right renal cysts noted. Electronically Signed   By: Garald Balding M.D.   On: 10/20/2018 22:15    EKG: Independently reviewed.  Assessment/Plan Principal Problem:   Acute cystitis with hematuria Active Problems:   Diabetes mellitus (HCC)   Prostate enlargement    1. UTI with hematuria - 1. Rocephin 2. Culture pending 2. DM2 - 1. SSI sensitive Q4H 3. Prostate enlargement and heterogenous appearance - 1. Checking PSA 2. May need f/u with urology.  DVT prophylaxis: SCDs Code Status: Full Family Communication: Family at bedside Disposition Plan: Home after admit Consults called: None Admission status: Place in [place], [place] [facility] Pager [phone] Only works nights!  If 7AM-7PM, please contact the primary day team physician taking care of patient  [email or web address] Password [facility]  10/20/2018, 11:12 PM

## 2018-10-20 NOTE — ED Provider Notes (Addendum)
[facility] EMERGENCY DEPARTMENT Provider Note   CSN: [id number] Arrival date & time: 10/20/18  1928     History   Chief Complaint Chief Complaint  Patient presents with  . Abdominal Pain    HPI [name] is a 67 y.o. male.  Patient with a history of urinary tract infection x1 several years ago while on vacation presents to the emergency department with 3 weeks of dysuria and hematuria.  Patient did not measure his temperature at home but has been having shaking chills at times.  Symptoms have become much worse over the past 2 to 3 days.  He is also developed a headache during this time.  He denies any back pain or abdominal pain.  He has severe pain at times with voiding.  He has had nausea without vomiting.  No diarrhea.  No skin rashes.  Patient has been told that he has had diabetes in the past but does not take any medication for this, stating he does not believe that he actually has diabetes.  He denies any history of prostate symptoms including frequent urination at night, difficulty with starting stream.  Patient seen at urgent care prior to arrival and referred to the emergency department.  Denies previous abdominal surgeries.     Past Medical History:  Diagnosis Date  . Arthritis   . Coronary artery disease    tandem 50/60% lesions in the LAD a 20% lesion the RCA catheterization 2004  . Degenerative disc disease   . Diabetes mellitus without complication ([place])   . Glucose intolerance (impaired glucose tolerance)   . HLD (hyperlipidemia)   . Hypothyroidism   . Impaired hearing   . Sebaceous cyst    neck  . Thyroid disease    not on meds  . Tinnitus   . Varicose veins     Patient Active Problem List   Diagnosis Date Noted  . Class 1 obesity due to excess calories with body mass index (BMI) of 33.0 to 33.9 in adult 09/09/2016  . Pre-operative cardiovascular examination 06/16/2011  . Coronary artery disease-nonobstructive 50% lesions tandem  and the LAD catheterization 2004 ejection fraction-50% 06/16/2011  . FATTY LIVER DISEASE 08/17/2010  . ARTHRITIS 08/17/2010  . FECAL OCCULT BLOOD 08/17/2010  . PANCREATITIS 08/13/2010  . HYPOTHYROIDISM 08/12/2010  . Diabetes mellitus ([place]) 08/12/2010  . HYPERCHOLESTEROLEMIA 08/12/2010  . GERD 08/12/2010  . HIATAL HERNIA 08/12/2010  . HEMOCCULT POSITIVE STOOL 08/12/2010    Past Surgical History:  Procedure Laterality Date  . CYST EXCISION Right 09/07/2018   Procedure: EXCISION OF RECURRENT SEBACEOUS CYST OF NECK;  Surgeon: Johnathan Hausen, MD;  Location: [place];  Service: General;  Laterality: Right;  posterior  . JOINT REPLACEMENT    . KNEE SURGERY     both knees  . MOUTH SURGERY     all teeth removed at age 55  . repair peroneus tendon tear    . SHOULDER SURGERY     both shoulders        Home Medications    Prior to Admission medications   Medication Sig Start Date End Date Taking? Authorizing Provider  Blood Glucose Monitoring Suppl (FIFTY50 GLUCOSE METER 2.0) w/Device KIT Please check blood sugars once daily due to fluctuating blood sugars. 09/09/16   [provider]  HYDROcodone-acetaminophen (NORCO/VICODIN) 5-325 MG tablet Take 1 tablet by mouth every 6 (six) hours as needed for moderate pain. 09/07/18   Johnathan Hausen, MD  levothyroxine ([place], [place]) 73  MCG tablet Take by mouth. 09/09/16   [provider]  metFORMIN (GLUCOPHAGE-XR) 500 MG 24 hr tablet Take 500 mg by mouth. 09/09/16   [provider]  simvastatin (ZOCOR) 5 MG tablet Take 5 mg by mouth. 09/09/16   [provider]    Family History Family History  Problem Relation Age of Onset  . Heart attack Brother        triple bypass  . Diabetes Brother   . Heart block Brother   . Diabetes Brother   . Diabetes Mother   . Diabetes Father   . Diabetes Other        siblings  . Heart defect Other        both parents    Social History Social  History   Tobacco Use  . Smoking status: Never Smoker  . Smokeless tobacco: Never Used  Substance Use Topics  . Alcohol use: No  . Drug use: No     Allergies   Patient has no known allergies.   Review of Systems Review of Systems  Constitutional: Positive for chills. Negative for fever.  HENT: Negative for rhinorrhea and sore throat.   Eyes: Negative for redness.  Respiratory: Negative for cough.   Cardiovascular: Negative for chest pain.  Gastrointestinal: Positive for nausea. Negative for abdominal pain, diarrhea and vomiting.  Genitourinary: Positive for dysuria, frequency and urgency. Negative for decreased urine volume and flank pain.  Musculoskeletal: Negative for myalgias.  Skin: Negative for rash.  Neurological: Negative for headaches.     Physical Exam Updated Vital Signs BP (!) 123/93   Pulse (!) 106   Temp 99 F (37.2 C) (Oral)   Resp 15   Ht _0  (1.803 m)   Wt 107.5 kg   SpO2 96%   BMI 33.05 kg/m   Physical Exam Vitals signs and nursing note reviewed.  Constitutional:      Appearance: He is well-developed.  HENT:     Head: Normocephalic and atraumatic.     Mouth/Throat:     Mouth: Mucous membranes are moist.  Eyes:     General:        Right eye: No discharge.        Left eye: No discharge.     Conjunctiva/sclera: Conjunctivae normal.  Neck:     Musculoskeletal: Normal range of motion and neck supple.  Cardiovascular:     Rate and Rhythm: Regular rhythm. Tachycardia present.     Heart sounds: Normal heart sounds.  Pulmonary:     Effort: Pulmonary effort is normal.     Breath sounds: Normal breath sounds.  Abdominal:     Palpations: Abdomen is soft.     Tenderness: There is no abdominal tenderness. There is no right CVA tenderness or left CVA tenderness.     Comments: Patient nontender to palpation of the abdomen.  States that pain occurs with voiding.  Skin:    General: Skin is warm and dry.  Neurological:     General: No focal  deficit present.     Mental Status: He is alert and oriented to person, place, and time.     Cranial Nerves: No cranial nerve deficit.     Motor: No weakness.      ED Treatments / Results  Labs (all labs ordered are listed, but only abnormal results are displayed) Labs Reviewed  COMPREHENSIVE METABOLIC PANEL - Abnormal; Notable for the following components:      Result Value   Sodium 131 (*)  Chloride 94 (*)    Glucose, Bld 321 (*)    Total Bilirubin 2.3 (*)    All other components within normal limits  CBC - Abnormal; Notable for the following components:   WBC 27.2 (*)    All other components within normal limits  URINALYSIS, ROUTINE W REFLEX MICROSCOPIC - Abnormal; Notable for the following components:   Color, Urine AMBER (*)    APPearance CLOUDY (*)    Specific Gravity, Urine 1.031 (*)    Glucose, UA >=500 (*)    Hgb urine dipstick LARGE (*)    Ketones, ur 20 (*)    Protein, ur 100 (*)    Leukocytes, UA LARGE (*)    RBC / HPF >50 (*)    WBC, UA >50 (*)    Bacteria, UA RARE (*)    All other components within normal limits  I-STAT CG4 LACTIC ACID, ED - Abnormal; Notable for the following components:   Lactic Acid, Venous 2.13 (*)    All other components within normal limits  CULTURE, BLOOD (ROUTINE X 2)  CULTURE, BLOOD (ROUTINE X 2)  URINE CULTURE  LIPASE, BLOOD  HEMOGLOBIN A1C  PSA  TSH    EKG None  Radiology Ct Abdomen Pelvis W Contrast  Result Date: 10/20/2018 CLINICAL DATA:  Acute onset of suprapubic abdominal pain, dysuria and hematuria. EXAM: CT ABDOMEN AND PELVIS WITH CONTRAST TECHNIQUE: Multidetector CT imaging of the abdomen and pelvis was performed using the standard protocol following bolus administration of intravenous contrast. CONTRAST:  122m OMNIPAQUE IOHEXOL 300 MG/ML  SOLN COMPARISON:  CT of the abdomen and pelvis performed 04/14/2008, and abdominal ultrasound performed 04/15/2008 FINDINGS: Lower chest: The visualized lung bases are grossly  clear. The visualized portions of the mediastinum are unremarkable. Hepatobiliary: A calcified granuloma is noted within the liver. The gallbladder is unremarkable in appearance. The common bile duct is normal in caliber. Pancreas: The pancreas is within normal limits. Spleen: The spleen is unremarkable in appearance. Adrenals/Urinary Tract: The adrenal glands are unremarkable in appearance. Small right renal cysts are noted. Mild nonspecific perinephric stranding is noted bilaterally. There is no evidence of hydronephrosis. No renal or ureteral stones are identified. No definite filling defects are seen on delayed images. Stomach/Bowel: The stomach is unremarkable in appearance. The small bowel is within normal limits. The appendix is normal in caliber, without evidence of appendicitis. The colon is unremarkable in appearance. Vascular/Lymphatic: The abdominal aorta is unremarkable in appearance. The inferior vena cava is grossly unremarkable. No retroperitoneal lymphadenopathy is seen. No pelvic sidewall lymphadenopathy is identified. Reproductive: The bladder is mildly distended. Mild soft tissue inflammation about the bladder could reflect cystitis. High attenuation within the bladder may reflect clinically described hematuria. The prostate is enlarged, measuring 5.5 cm in transverse dimension, with scattered calcification and heterogeneity. Other: No additional soft tissue abnormalities are seen. Musculoskeletal: No acute osseous abnormalities are identified. The visualized musculature is unremarkable in appearance. IMPRESSION: 1. Mild soft tissue inflammation about the bladder could reflect cystitis. High attenuation within the bladder may reflect clinically described hematuria. 2. Enlarged prostate, with scattered calcification and heterogeneity. Would correlate with PSA. 3. Small right renal cysts noted. Electronically Signed   By: JGarald BaldingM.D.   On: 10/20/2018 22:15    Procedures Procedures  (including critical care time)  Medications Ordered in ED Medications  ondansetron (ZOFRAN-ODT) disintegrating tablet 4 mg (has no administration in time range)  cefTRIAXone (ROCEPHIN) 1 g in sodium chloride 0.9 % 100 mL IVPB (1 g  Intravenous New Bag/Given 10/20/18 2239)  sodium chloride 0.9 % bolus 1,000 mL (1,000 mLs Intravenous New Bag/Given 10/20/18 2240)  iohexol (OMNIPAQUE) 300 MG/ML solution 100 mL (100 mLs Intravenous Contrast Given 10/20/18 2155)  metoCLOPramide (REGLAN) injection 10 mg (10 mg Intravenous Given 10/20/18 2243)  fentaNYL (SUBLIMAZE) injection 50 mcg (50 mcg Intravenous Given 10/20/18 2243)     Initial Impression / Assessment and Plan / ED Course  I have reviewed the triage vital signs and the nursing notes.  Pertinent labs & imaging results that were available during my care of the patient were reviewed by me and considered in my medical decision making (see chart for details).  Clinical Course as of Oct 21 2327  Sat Oct 20, 2720  673 67 year old male here with couple weeks of abdominal pain hematuria.  He was seen in urgent care and transferred here for further evaluation.  He is a markedly elevated white blood cell count and a grossly infected urine with greater than 50 whites and reds.  Restarting him on some IV fluids and antibiotics.  He will need to be admitted for IV antibiotics and further management.   [MB]    Clinical Course User Index [MB] Hayden Rasmussen, MD    Patient seen and examined.  Work-up reviewed.  Leukocytosis noted.  CT with signs of cystitis and enlarged prostate.  Patient agreeable to admission.  Fluids, antibiotics ordered.  Reglan for headache.  No neuro deficits noted.  Discussed with Dr. Rogue Jury.   Vital signs reviewed and are as follows: BP 134/80   Pulse (!) 105   Temp 99 F (37.2 C) (Oral)   Resp (!) 25   Ht _0  (1.803 m)   Wt 107.5 kg   SpO2 97%   BMI 33.05 kg/m   10:57 PM Spoke with Dr. Alcario Drought who will see  patient.   Final Clinical Impressions(s) / ED Diagnoses   Final diagnoses:  Sepsis without acute organ dysfunction, due to unspecified organism ([place])  Acute cystitis with hematuria  Hyperglycemia   Admit.   ED Discharge Orders    None       Carlisle Cater, PA-C 10/20/18 2257    Carlisle Cater, PA-C 10/20/18 2328    Hayden Rasmussen, MD 10/21/18 1326

## 2018-10-20 NOTE — ED Notes (Signed)
PA Geiple informed of lactic acid result 2.13

## 2018-10-20 NOTE — ED Triage Notes (Signed)
Pt presents with urinary tract symptoms; painful urination, headache, unsteadiness, and blood in urine.

## 2018-10-20 NOTE — ED Triage Notes (Signed)
Patient c/o abdominal pain x3 weeks. Also c/o blood in urine.

## 2018-10-21 DIAGNOSIS — A419 Sepsis, unspecified organism: Secondary | ICD-10-CM | POA: Diagnosis not present

## 2018-10-21 DIAGNOSIS — N3001 Acute cystitis with hematuria: Secondary | ICD-10-CM | POA: Diagnosis not present

## 2018-10-21 DIAGNOSIS — N400 Enlarged prostate without lower urinary tract symptoms: Secondary | ICD-10-CM | POA: Diagnosis not present

## 2018-10-21 DIAGNOSIS — E1165 Type 2 diabetes mellitus with hyperglycemia: Secondary | ICD-10-CM | POA: Diagnosis not present

## 2018-10-21 LAB — CBC
HCT: 40.2 % (ref 39.0–52.0)
HEMOGLOBIN: 13.5 g/dL (ref 13.0–17.0)
MCH: 28.5 pg (ref 26.0–34.0)
MCHC: 33.6 g/dL (ref 30.0–36.0)
MCV: 85 fL (ref 80.0–100.0)
Platelets: 191 10*3/uL (ref 150–400)
RBC: 4.73 MIL/uL (ref 4.22–5.81)
RDW: 13.6 % (ref 11.5–15.5)
WBC: 24.4 10*3/uL — ABNORMAL HIGH (ref 4.0–10.5)
nRBC: 0 % (ref 0.0–0.2)

## 2018-10-21 LAB — GLUCOSE, CAPILLARY
GLUCOSE-CAPILLARY: 336 mg/dL — AB (ref 70–99)
Glucose-Capillary: 207 mg/dL — ABNORMAL HIGH (ref 70–99)
Glucose-Capillary: 275 mg/dL — ABNORMAL HIGH (ref 70–99)
Glucose-Capillary: 283 mg/dL — ABNORMAL HIGH (ref 70–99)
Glucose-Capillary: 315 mg/dL — ABNORMAL HIGH (ref 70–99)
Glucose-Capillary: 319 mg/dL — ABNORMAL HIGH (ref 70–99)

## 2018-10-21 LAB — BASIC METABOLIC PANEL
Anion gap: 12 (ref 5–15)
BUN: 9 mg/dL (ref 8–23)
CO2: 23 mmol/L (ref 22–32)
Calcium: 8.2 mg/dL — ABNORMAL LOW (ref 8.9–10.3)
Chloride: 98 mmol/L (ref 98–111)
Creatinine, Ser: 1.02 mg/dL (ref 0.61–1.24)
GFR calc Af Amer: >60 mL/min (ref >60)
GFR calc non Af Amer: >60 mL/min (ref >60)
Glucose, Bld: 238 mg/dL — ABNORMAL HIGH (ref 70–99)
POTASSIUM: 3.2 mmol/L — AB (ref 3.5–5.1)
Sodium: 133 mmol/L — ABNORMAL LOW (ref 135–145)

## 2018-10-21 LAB — TSH: TSH: 45.595 u[IU]/mL — ABNORMAL HIGH (ref 0.350–4.500)

## 2018-10-21 LAB — PSA: Prostatic Specific Antigen: 16.33 ng/mL — ABNORMAL HIGH (ref 0.00–4.00)

## 2018-10-21 LAB — HEMOGLOBIN A1C
HEMOGLOBIN A1C: 11.8 % — AB (ref 4.8–5.6)
Mean Plasma Glucose: 291.96 mg/dL

## 2018-10-21 MED ORDER — INSULIN GLARGINE 100 UNIT/ML ~~LOC~~ SOLN
8.0000 [IU] | Freq: Every day | SUBCUTANEOUS | Status: DC
  Administered 2018-10-21 – 2018-10-22 (×2): 8 [IU] via SUBCUTANEOUS
  Filled 2018-10-21 (×2): qty 0.08

## 2018-10-21 MED ORDER — POTASSIUM CHLORIDE CRYS ER 20 MEQ PO TBCR
40.0000 meq | EXTENDED_RELEASE_TABLET | Freq: Once | ORAL | Status: AC
  Administered 2018-10-21: 40 meq via ORAL
  Filled 2018-10-21: qty 2

## 2018-10-21 NOTE — Progress Notes (Signed)
[facility] PROGRESS NOTE  [name] HFW:[id number] DOB: 10/28/1950 DOA: 10/20/2018  PCP: Patient, No Pcp Per  Brief History/Interval Summary: 67 y.o. male with medical history significant of CAD, DM2, hypothyroidism not taking meds for past 5 years or so.  Patient presented with 3-week history of dysuria and hematuria with some chills at home.  Symptoms worse over the last 2 to 3 days.  Evaluation revealed UTI.  Patient hospitalized for further management.  Reason for Visit: Urinary tract infection with possible hemorrhagic cystitis  Consultants: None  Procedures: None  Antibiotics: Ceftriaxone  Subjective/Interval History: Patient states that he continues to have discomfort with urinating.  Continues to have blood in the urine although it is clearing up.  Denies any abdominal pain nausea or vomiting.  ROS: Denies any chest pain or shortness of breath.  Objective:  Vital Signs  Vitals:   10/20/18 2300 10/20/18 2315 10/21/18 0013 10/21/18 0417  BP: (!) 123/91 130/85 124/72 120/69  Pulse: (!) 101 (!) 101 (!) 101 98  Resp: (!) 25 (!) 24 19 18   Temp:   99.4 F (37.4 C) 98.8 F (37.1 C)  TempSrc:   Oral Oral  SpO2: 92% 98% 99% 100%  Weight:      Height:        Intake/Output Summary (Last 24 hours) at 10/21/2018 1148 Last data filed at 10/21/2018 0804 Gross per 24 hour  Intake 350 ml  Output 650 ml  Net -300 ml   Filed Weights   10/20/18 1955  Weight: 107.5 kg    General appearance: alert, cooperative, appears stated age and no distress Resp: clear to auscultation bilaterally Cardio: regular rate and rhythm, S1, S2 normal, no murmur, click, rub or gallop GI: soft, non-tender; bowel sounds normal; no masses,  no organomegaly Extremities: extremities normal, atraumatic, no cyanosis or edema Pulses: 2+ and symmetric Neurologic: Alert and oriented x3.  No focal neurological deficits.  Lab Results:  Data Reviewed: I have personally reviewed following  labs and imaging studies  CBC: Recent Labs  Lab 10/20/18 1913 10/20/18 2002 10/21/18 0453  WBC  --  27.2* 24.4*  HGB 16.3 15.3 13.5  HCT 48.0 45.0 40.2  MCV  --  85.6 85.0  PLT  --  214 027    Basic Metabolic Panel: Recent Labs  Lab 10/20/18 1913 10/20/18 2002 10/21/18 0453  NA 130* 131* 133*  K 4.2 3.7 3.2*  CL 94* 94* 98  CO2  --  24 23  GLUCOSE 324* 321* 238*  BUN 12 12 9   CREATININE 0.80 1.14 1.02  CALCIUM  --  8.9 8.2*    GFR: Estimated Creatinine Clearance: 85.3 mL/min (by C-G formula based on SCr of 1.02 mg/dL).  Liver Function Tests: Recent Labs  Lab 10/20/18 2002  AST 34  ALT 34  ALKPHOS 75  BILITOT 2.3*  PROT 7.1  ALBUMIN 3.8    Recent Labs  Lab 10/20/18 2002  LIPASE 24    HbA1C: Recent Labs    10/20/18 2002  HGBA1C 11.8*    CBG: Recent Labs  Lab 10/21/18 0021 10/21/18 0415 10/21/18 0735  GLUCAP 283* 207* 315*    Thyroid Function Tests: Recent Labs    10/21/18 0453  TSH 45.595*     Recent Results (from the past 240 hour(s))  Blood culture (routine x 2)     Status: None (Preliminary result)   Collection Time: 10/20/18 10:24 PM  Result Value Ref Range Status   Specimen Description BLOOD LEFT  ANTECUBITAL  Final   Special Requests   Final    BOTTLES DRAWN AEROBIC AND ANAEROBIC Blood Culture results may not be optimal due to an inadequate volume of blood received in culture bottles   Culture   Final    NO GROWTH < 12 HOURS Performed at [facility] [address], [place], [place] [address]    Report Status PENDING  Incomplete  Blood culture (routine x 2)     Status: None (Preliminary result)   Collection Time: 10/20/18 10:29 PM  Result Value Ref Range Status   Specimen Description BLOOD LEFT FOREARM  Final   Special Requests   Final    BOTTLES DRAWN AEROBIC AND ANAEROBIC Blood Culture results may not be optimal due to an inadequate volume of blood received in culture bottles   Culture   Final    NO GROWTH < 12  HOURS Performed at [facility], [place] [address] [place], [place] [address]    Report Status PENDING  Incomplete      Radiology Studies: Ct Abdomen Pelvis W Contrast  Result Date: 10/20/2018 CLINICAL DATA:  Acute onset of suprapubic abdominal pain, dysuria and hematuria. EXAM: CT ABDOMEN AND PELVIS WITH CONTRAST TECHNIQUE: Multidetector CT imaging of the abdomen and pelvis was performed using the standard protocol following bolus administration of intravenous contrast. CONTRAST:  181mL OMNIPAQUE IOHEXOL 300 MG/ML  SOLN COMPARISON:  CT of the abdomen and pelvis performed 04/14/2008, and abdominal ultrasound performed 04/15/2008 FINDINGS: Lower chest: The visualized lung bases are grossly clear. The visualized portions of the mediastinum are unremarkable. Hepatobiliary: A calcified granuloma is noted within the liver. The gallbladder is unremarkable in appearance. The common bile duct is normal in caliber. Pancreas: The pancreas is within normal limits. Spleen: The spleen is unremarkable in appearance. Adrenals/Urinary Tract: The adrenal glands are unremarkable in appearance. Small right renal cysts are noted. Mild nonspecific perinephric stranding is noted bilaterally. There is no evidence of hydronephrosis. No renal or ureteral stones are identified. No definite filling defects are seen on delayed images. Stomach/Bowel: The stomach is unremarkable in appearance. The small bowel is within normal limits. The appendix is normal in caliber, without evidence of appendicitis. The colon is unremarkable in appearance. Vascular/Lymphatic: The abdominal aorta is unremarkable in appearance. The inferior vena cava is grossly unremarkable. No retroperitoneal lymphadenopathy is seen. No pelvic sidewall lymphadenopathy is identified. Reproductive: The bladder is mildly distended. Mild soft tissue inflammation about the bladder could reflect cystitis. High attenuation within the bladder may reflect clinically  described hematuria. The prostate is enlarged, measuring 5.5 cm in transverse dimension, with scattered calcification and heterogeneity. Other: No additional soft tissue abnormalities are seen. Musculoskeletal: No acute osseous abnormalities are identified. The visualized musculature is unremarkable in appearance. IMPRESSION: 1. Mild soft tissue inflammation about the bladder could reflect cystitis. High attenuation within the bladder may reflect clinically described hematuria. 2. Enlarged prostate, with scattered calcification and heterogeneity. Would correlate with PSA. 3. Small right renal cysts noted. Electronically Signed   By: Garald Balding M.D.   On: 10/20/2018 22:15     Medications:  Scheduled: . insulin aspart  0-9 Units Subcutaneous Q4H  . potassium chloride  40 mEq Oral Once   Continuous: . sodium chloride 100 mL/hr at 10/21/18 1003  . cefTRIAXone (ROCEPHIN)  IV     WJX:BJYNWGNFAOZHY **OR** acetaminophen, HYDROcodone-acetaminophen, ondansetron **OR** ondansetron (ZOFRAN) IV, ondansetron    Assessment/Plan:  UTI with hematuria/concern for hemorrhagic cystitis/Sepsis present on admission CT scan  report reviewed.  Patient started on ceftriaxone.  Follow-up on urine cultures.  Symptoms are stable.  Patient was noted to be tachypneic tachycardic with elevated WBC at presentation.  He was noted to have sepsis.  Lactic acid level was elevated.  Seems to be stable currently.  Diabetes mellitus type 2, uncontrolled Currently not on any medications for same.  HbA1c noted to be 11.8.  CBGs being monitored and noted to be elevated.  We will place him on Lantus.  Hypothyroidism Patient stopped taking his medication many years ago.  TSH noted to be 45.5.  We will check free T4 and free T3.  Will need to reinitiate him on levothyroxine.  This was discussed with the patient.  Enlarged prostate Noted incidentally on CT scan.  PSA noted to be 16.  Will need to be evaluated by urology in the  outpatient setting.  Hypokalemia This will be repleted.  Small right renal cysts Incidentally noted on CT scan.  Outpatient monitoring.  DVT Prophylaxis: SCDs    Code Status: Full code Family Communication: Discussed with the patient Disposition Plan: Management as outlined above.    LOS: 0 days   [facility] Pager [phone] 10/21/2018, 11:48 AM  If 7PM-7AM, please contact night-coverage at [email or web address], password [facility]

## 2018-10-21 NOTE — Plan of Care (Signed)
  Problem: Pain Managment: Goal: General experience of comfort will improve Outcome: Progressing   Problem: Safety: Goal: Ability to remain free from injury will improve Outcome: Progressing   Problem: Skin Integrity: Goal: Risk for impaired skin integrity will decrease Outcome: Progressing   

## 2018-10-22 ENCOUNTER — Encounter (HOSPITAL_COMMUNITY): Payer: Self-pay | Admitting: General Practice

## 2018-10-22 DIAGNOSIS — R109 Unspecified abdominal pain: Secondary | ICD-10-CM | POA: Diagnosis not present

## 2018-10-22 DIAGNOSIS — E039 Hypothyroidism, unspecified: Secondary | ICD-10-CM | POA: Diagnosis present

## 2018-10-22 DIAGNOSIS — R0981 Nasal congestion: Secondary | ICD-10-CM | POA: Diagnosis present

## 2018-10-22 DIAGNOSIS — Z79899 Other long term (current) drug therapy: Secondary | ICD-10-CM | POA: Diagnosis not present

## 2018-10-22 DIAGNOSIS — N281 Cyst of kidney, acquired: Secondary | ICD-10-CM | POA: Diagnosis present

## 2018-10-22 DIAGNOSIS — Z7984 Long term (current) use of oral hypoglycemic drugs: Secondary | ICD-10-CM | POA: Diagnosis not present

## 2018-10-22 DIAGNOSIS — N400 Enlarged prostate without lower urinary tract symptoms: Secondary | ICD-10-CM | POA: Diagnosis present

## 2018-10-22 DIAGNOSIS — E785 Hyperlipidemia, unspecified: Secondary | ICD-10-CM | POA: Diagnosis present

## 2018-10-22 DIAGNOSIS — Z79890 Hormone replacement therapy (postmenopausal): Secondary | ICD-10-CM | POA: Diagnosis not present

## 2018-10-22 DIAGNOSIS — Z8249 Family history of ischemic heart disease and other diseases of the circulatory system: Secondary | ICD-10-CM | POA: Diagnosis not present

## 2018-10-22 DIAGNOSIS — Z833 Family history of diabetes mellitus: Secondary | ICD-10-CM | POA: Diagnosis not present

## 2018-10-22 DIAGNOSIS — E876 Hypokalemia: Secondary | ICD-10-CM | POA: Diagnosis not present

## 2018-10-22 DIAGNOSIS — A4153 Sepsis due to Serratia: Secondary | ICD-10-CM | POA: Diagnosis present

## 2018-10-22 DIAGNOSIS — I2510 Atherosclerotic heart disease of native coronary artery without angina pectoris: Secondary | ICD-10-CM | POA: Diagnosis present

## 2018-10-22 DIAGNOSIS — N3001 Acute cystitis with hematuria: Secondary | ICD-10-CM | POA: Diagnosis present

## 2018-10-22 DIAGNOSIS — E1165 Type 2 diabetes mellitus with hyperglycemia: Secondary | ICD-10-CM | POA: Diagnosis present

## 2018-10-22 DIAGNOSIS — A419 Sepsis, unspecified organism: Secondary | ICD-10-CM | POA: Diagnosis not present

## 2018-10-22 DIAGNOSIS — R51 Headache: Secondary | ICD-10-CM | POA: Diagnosis present

## 2018-10-22 LAB — CBC
HCT: 38.4 % — ABNORMAL LOW (ref 39.0–52.0)
Hemoglobin: 13.1 g/dL (ref 13.0–17.0)
MCH: 29 pg (ref 26.0–34.0)
MCHC: 34.1 g/dL (ref 30.0–36.0)
MCV: 85.1 fL (ref 80.0–100.0)
PLATELETS: 181 10*3/uL (ref 150–400)
RBC: 4.51 MIL/uL (ref 4.22–5.81)
RDW: 13.9 % (ref 11.5–15.5)
WBC: 20.8 10*3/uL — ABNORMAL HIGH (ref 4.0–10.5)
nRBC: 0 % (ref 0.0–0.2)

## 2018-10-22 LAB — T4, FREE: FREE T4: 0.5 ng/dL — AB (ref 0.82–1.77)

## 2018-10-22 LAB — BASIC METABOLIC PANEL
Anion gap: 8 (ref 5–15)
BUN: 9 mg/dL (ref 8–23)
CO2: 22 mmol/L (ref 22–32)
Calcium: 8.1 mg/dL — ABNORMAL LOW (ref 8.9–10.3)
Chloride: 103 mmol/L (ref 98–111)
Creatinine, Ser: 0.95 mg/dL (ref 0.61–1.24)
GFR calc Af Amer: >60 mL/min (ref >60)
GFR calc non Af Amer: >60 mL/min (ref >60)
Glucose, Bld: 248 mg/dL — ABNORMAL HIGH (ref 70–99)
Potassium: 3.5 mmol/L (ref 3.5–5.1)
SODIUM: 133 mmol/L — AB (ref 135–145)

## 2018-10-22 LAB — GLUCOSE, CAPILLARY
Glucose-Capillary: 186 mg/dL — ABNORMAL HIGH (ref 70–99)
Glucose-Capillary: 225 mg/dL — ABNORMAL HIGH (ref 70–99)
Glucose-Capillary: 227 mg/dL — ABNORMAL HIGH (ref 70–99)
Glucose-Capillary: 236 mg/dL — ABNORMAL HIGH (ref 70–99)
Glucose-Capillary: 251 mg/dL — ABNORMAL HIGH (ref 70–99)
Glucose-Capillary: 274 mg/dL — ABNORMAL HIGH (ref 70–99)
Glucose-Capillary: 323 mg/dL — ABNORMAL HIGH (ref 70–99)

## 2018-10-22 LAB — MAGNESIUM: Magnesium: 1.6 mg/dL — ABNORMAL LOW (ref 1.7–2.4)

## 2018-10-22 LAB — HIV ANTIBODY (ROUTINE TESTING W REFLEX): HIV Screen 4th Generation wRfx: NONREACTIVE

## 2018-10-22 MED ORDER — INSULIN GLARGINE 100 UNIT/ML ~~LOC~~ SOLN
10.0000 [IU] | Freq: Every day | SUBCUTANEOUS | Status: DC
  Administered 2018-10-23: 10 [IU] via SUBCUTANEOUS
  Filled 2018-10-22: qty 0.1

## 2018-10-22 MED ORDER — INSULIN ASPART 100 UNIT/ML ~~LOC~~ SOLN
0.0000 [IU] | Freq: Three times a day (TID) | SUBCUTANEOUS | Status: DC
  Administered 2018-10-22 – 2018-10-23 (×2): 8 [IU] via SUBCUTANEOUS
  Administered 2018-10-23: 5 [IU] via SUBCUTANEOUS

## 2018-10-22 MED ORDER — FLUTICASONE PROPIONATE 50 MCG/ACT NA SUSP
2.0000 | Freq: Every day | NASAL | Status: DC
  Administered 2018-10-22 – 2018-10-23 (×2): 2 via NASAL
  Filled 2018-10-22: qty 16

## 2018-10-22 MED ORDER — LEVOTHYROXINE SODIUM 50 MCG PO TABS
50.0000 ug | ORAL_TABLET | Freq: Every day | ORAL | Status: DC
  Administered 2018-10-22 – 2018-10-23 (×2): 50 ug via ORAL
  Filled 2018-10-22 (×2): qty 1

## 2018-10-22 MED ORDER — LIVING WELL WITH DIABETES BOOK
Freq: Once | Status: DC
  Filled 2018-10-22: qty 1

## 2018-10-22 MED ORDER — MAGNESIUM SULFATE 2 GM/50ML IV SOLN
2.0000 g | Freq: Once | INTRAVENOUS | Status: AC
  Administered 2018-10-22: 2 g via INTRAVENOUS
  Filled 2018-10-22: qty 50

## 2018-10-22 MED ORDER — LIVING WELL WITH DIABETES BOOK
Freq: Once | Status: AC
  Administered 2018-10-22: 15:00:00
  Filled 2018-10-22: qty 1

## 2018-10-22 MED ORDER — POTASSIUM CHLORIDE CRYS ER 20 MEQ PO TBCR
40.0000 meq | EXTENDED_RELEASE_TABLET | Freq: Once | ORAL | Status: AC
  Administered 2018-10-22: 40 meq via ORAL
  Filled 2018-10-22: qty 2

## 2018-10-22 MED ORDER — INSULIN ASPART 100 UNIT/ML ~~LOC~~ SOLN
0.0000 [IU] | Freq: Every day | SUBCUTANEOUS | Status: DC
  Administered 2018-10-22: 2 [IU] via SUBCUTANEOUS

## 2018-10-22 NOTE — Progress Notes (Addendum)
Inpatient Diabetes Program Recommendations  AACE/ADA: New Consensus Statement on Inpatient Glycemic Control (2015)  Target Ranges:  Prepandial:   less than 140 mg/dL      Peak postprandial:   less than 180 mg/dL (1-2 hours)      Critically ill patients:  140 - 180 mg/dL   Lab Results  Component Value Date   GLUCAP 186 (H) 10/22/2018   HGBA1C 11.8 (H) 10/20/2018    Review of Glycemic Control Results for [name], [name] (MRN [id number]) as of 10/22/2018 11:34  Ref. Range 10/21/2018 07:35 10/21/2018 11:49 10/21/2018 16:15 10/21/2018 20:13 10/22/2018 00:11 10/22/2018 04:17 10/22/2018 08:00  Glucose-Capillary Latest Ref Range: 70 - 99 mg/dL 315 (H) 319 (H) 336 (H) 275 (H) 227 (H) 251 (H) 186 (H)   Diabetes history: DM2 Outpatient Diabetes medications: None Current orders for Inpatient glycemic control: Lantus 8 units + Novolog sensitive correction tid + hs 0-5 units  Inpatient Diabetes Program Recommendations:   Patient has received a total of 35 units Novolog over the past 24 hrs. -Increase Lantus to 22 units (0.2 units/kg x 107.5 kg) -Change Novolog correction to moderate tid + hs 0-5 units -Novolog 4 units tid meal coverage if eats 50% Text page sent to Dr. Maryland Pink.  Will plan to speak with patient regarding elevated A1c of 11.8. 1:45 pm Spoke with patient and wife @ bedside about new diagnosis. Discussed A1C results 11.8 (Average blood glucose 292 over the past 2-3 months) and explained what an A1C is, basic pathophysiology of DM Type 2, basic home care, basic diabetes diet nutrition principles, importance of checking CBGs and maintaining good CBG control to prevent long-term and short-term complications. Reviewed signs and symptoms of hyperglycemia and hypoglycemia and how to treat hypoglycemia at home. Also reviewed blood sugar goals at home.  RNs to provide ongoing basic DM education at bedside with this patient. Have ordered educational booklet, DM videos and placed RD consult for  DM diet education for this patient.  Patient shared that he was told in the past by his PCP that he was "borderline"  But states new to DM dx and medication. Patient has been drinking tea with 1/2 cup sugar per pitcher and regular drinks. Reviewed effect of sugary drinks and carbohydrates on blood sugars. Patient agrees to decrease carbohydrate and drinks containing sugar. Patient requests to attend outpatient diabetes classes @ [facility] which is closer to their home and knows to call [facility] to set up time. Patient will need @ discharge: -Blood glucose meter kit (includes lancets & strips) # [id number]  Nurses to start education on insulin injections and allowing patient to give injections in order to prepare if patient goes home on insulin.   Thank you, Nani Gasser. Gregg Winchell, RN, MSN, CDE  Diabetes Coordinator Inpatient Glycemic Control Team Team Pager [phone] (8am-5pm) 10/22/2018 11:38 AM

## 2018-10-22 NOTE — Progress Notes (Addendum)
[facility] PROGRESS NOTE  [name] JJH:[id number] DOB: [birth year] DOA: 10/20/2018  PCP: Patient, No Pcp Per  Brief History/Interval Summary: 67 y.o. male with medical history significant of CAD, DM2, hypothyroidism not taking meds for past 5 years or so.  Patient presented with 3-week history of dysuria and hematuria with some chills at home.  Symptoms worse over the last 2 to 3 days.  Evaluation revealed UTI.  Patient hospitalized for further management.  Reason for Visit: Urinary tract infection with possible hemorrhagic cystitis  Consultants: None  Procedures: None  Antibiotics: Ceftriaxone  Subjective/Interval History: Patient states that he continues to have some discomfort with urinating.  Denies any blood in the urine anymore.  No abdominal pain.  No nausea vomiting.  ROS: Denies any chest pain or shortness of breath.  Does complain of frontal headache with some sinus congestion.  Objective:  Vital Signs  Vitals:   10/21/18 0417 10/21/18 1405 10/21/18 2017 10/22/18 0419  BP: 120/69 111/68 118/72 126/80  Pulse: 98 (!) 107 98 94  Resp: 18 20 16 16   Temp: 98.8 F (37.1 C) 98.1 F (36.7 C) 99.1 F (37.3 C) 99.8 F (37.7 C)  TempSrc: Oral Oral Oral Oral  SpO2: 100% 99% 97% 97%  Weight:      Height:        Intake/Output Summary (Last 24 hours) at 10/22/2018 1128 Last data filed at 10/22/2018 0653 Gross per 24 hour  Intake 1160 ml  Output 1030 ml  Net 130 ml   Filed Weights   10/20/18 1955  Weight: 107.5 kg   General appearance: Awake alert.  In no distress Resp: Clear to auscultation bilaterally.  Normal effort Cardio: S1-S2 is normal regular.  No S3-S4.  No rubs murmurs or bruit GI: Abdomen is soft.  Nontender nondistended.  Bowel sounds are present normal.  No masses organomegaly Extremities: No edema.  Full range of motion of lower extremities. Neurologic: Alert and oriented x3.  No focal neurological deficits.    Lab Results:  Data  Reviewed: I have personally reviewed following labs and imaging studies  CBC: Recent Labs  Lab 10/20/18 1913 10/20/18 2002 10/21/18 0453 10/22/18 0148  WBC  --  27.2* 24.4* 20.8*  HGB 16.3 15.3 13.5 13.1  HCT 48.0 45.0 40.2 38.4*  MCV  --  85.6 85.0 85.1  PLT  --  214 191 818    Basic Metabolic Panel: Recent Labs  Lab 10/20/18 1913 10/20/18 2002 10/21/18 0453 10/22/18 0148  NA 130* 131* 133* 133*  K 4.2 3.7 3.2* 3.5  CL 94* 94* 98 103  CO2  --  24 23 22   GLUCOSE 324* 321* 238* 248*  BUN 12 12 9 9   CREATININE 0.80 1.14 1.02 0.95  CALCIUM  --  8.9 8.2* 8.1*  MG  --   --   --  1.6*    GFR: Estimated Creatinine Clearance: 91.6 mL/min (by C-G formula based on SCr of 0.95 mg/dL).  Liver Function Tests: Recent Labs  Lab 10/20/18 2002  AST 34  ALT 34  ALKPHOS 75  BILITOT 2.3*  PROT 7.1  ALBUMIN 3.8    Recent Labs  Lab 10/20/18 2002  LIPASE 24    HbA1C: Recent Labs    10/20/18 2002  HGBA1C 11.8*    CBG: Recent Labs  Lab 10/21/18 1615 10/21/18 2013 10/22/18 0011 10/22/18 0417 10/22/18 0800  GLUCAP 336* 275* 227* 251* 186*    Thyroid Function Tests: Recent Labs  10/21/18 0453 10/22/18 0148  TSH 45.595*  --   FREET4  --  0.50*     Recent Results (from the past 240 hour(s))  Urine culture     Status: Abnormal (Preliminary result)   Collection Time: 10/20/18  6:59 PM  Result Value Ref Range Status   Specimen Description URINE, RANDOM  Final   Special Requests NONE  Final   Culture (A)  Final    >=100,000 COLONIES/mL SERRATIA MARCESCENS REPEATING SUSCEPTIBILITIES Performed at [facility], [place] [address], [place], [place] [address]    Report Status PENDING  Incomplete  Blood culture (routine x 2)     Status: None (Preliminary result)   Collection Time: 10/20/18 10:24 PM  Result Value Ref Range Status   Specimen Description BLOOD LEFT ANTECUBITAL  Final   Special Requests   Final    BOTTLES DRAWN AEROBIC AND ANAEROBIC Blood  Culture results may not be optimal due to an inadequate volume of blood received in culture bottles   Culture   Final    NO GROWTH < 12 HOURS Performed at [facility], [place] [address] [place], [place] [address]    Report Status PENDING  Incomplete  Blood culture (routine x 2)     Status: None (Preliminary result)   Collection Time: 10/20/18 10:29 PM  Result Value Ref Range Status   Specimen Description BLOOD LEFT FOREARM  Final   Special Requests   Final    BOTTLES DRAWN AEROBIC AND ANAEROBIC Blood Culture results may not be optimal due to an inadequate volume of blood received in culture bottles   Culture   Final    NO GROWTH < 12 HOURS Performed at [facility], [place] [address] [place], [place] [address]    Report Status PENDING  Incomplete      Radiology Studies: Ct Abdomen Pelvis W Contrast  Result Date: 10/20/2018 CLINICAL DATA:  Acute onset of suprapubic abdominal pain, dysuria and hematuria. EXAM: CT ABDOMEN AND PELVIS WITH CONTRAST TECHNIQUE: Multidetector CT imaging of the abdomen and pelvis was performed using the standard protocol following bolus administration of intravenous contrast. CONTRAST:  164mL OMNIPAQUE IOHEXOL 300 MG/ML  SOLN COMPARISON:  CT of the abdomen and pelvis performed 04/14/2008, and abdominal ultrasound performed 04/15/2008 FINDINGS: Lower chest: The visualized lung bases are grossly clear. The visualized portions of the mediastinum are unremarkable. Hepatobiliary: A calcified granuloma is noted within the liver. The gallbladder is unremarkable in appearance. The common bile duct is normal in caliber. Pancreas: The pancreas is within normal limits. Spleen: The spleen is unremarkable in appearance. Adrenals/Urinary Tract: The adrenal glands are unremarkable in appearance. Small right renal cysts are noted. Mild nonspecific perinephric stranding is noted bilaterally. There is no evidence of hydronephrosis. No renal or ureteral stones are identified.  No definite filling defects are seen on delayed images. Stomach/Bowel: The stomach is unremarkable in appearance. The small bowel is within normal limits. The appendix is normal in caliber, without evidence of appendicitis. The colon is unremarkable in appearance. Vascular/Lymphatic: The abdominal aorta is unremarkable in appearance. The inferior vena cava is grossly unremarkable. No retroperitoneal lymphadenopathy is seen. No pelvic sidewall lymphadenopathy is identified. Reproductive: The bladder is mildly distended. Mild soft tissue inflammation about the bladder could reflect cystitis. High attenuation within the bladder may reflect clinically described hematuria. The prostate is enlarged, measuring 5.5 cm in transverse dimension, with scattered calcification and heterogeneity. Other: No additional soft tissue abnormalities are seen. Musculoskeletal: No acute  osseous abnormalities are identified. The visualized musculature is unremarkable in appearance. IMPRESSION: 1. Mild soft tissue inflammation about the bladder could reflect cystitis. High attenuation within the bladder may reflect clinically described hematuria. 2. Enlarged prostate, with scattered calcification and heterogeneity. Would correlate with PSA. 3. Small right renal cysts noted. Electronically Signed   By: Garald Balding M.D.   On: 10/20/2018 22:15     Medications:  Scheduled: . insulin aspart  0-9 Units Subcutaneous Q4H  . insulin glargine  8 Units Subcutaneous Daily  . levothyroxine  50 mcg Oral Q0600   Continuous: . sodium chloride 75 mL/hr at 10/22/18 1019  . cefTRIAXone (ROCEPHIN)  IV 1 g (10/21/18 2247)   EBR:[id number] **OR** acetaminophen, HYDROcodone-acetaminophen, ondansetron **OR** ondansetron (ZOFRAN) IV, ondansetron    Assessment/Plan:  UTI with hematuria/concern for hemorrhagic cystitis/Sepsis present on admission CT scan report reviewed.  Patient started on ceftriaxone.  Urine culture growing Serratia.   Sensitivities are pending.  Symptoms have improved.  WBC still elevated though better.  He was noted to have sepsis.  Lactic acid level was elevated.  Mobilize.  Diabetes mellitus type 2, uncontrolled Currently not on any medications for same.  HbA1c noted to be 11.8.  CBGs being monitored and noted to be elevated.  Lantus was initiated yesterday.  We will increase the dose.  Hypothyroidism Patient stopped taking his medication many years ago.  TSH noted to be 45.5.  Free T4 noted to be low at 0.5.  Patient will be started on 50 mcg of levothyroxine.    Enlarged prostate Noted incidentally on CT scan.  PSA noted to be 16.  Will need to be evaluated by urology in the outpatient setting.  Hypokalemia and hypomagnesemia These will be repleted  Small right renal cysts Incidentally noted on CT scan.  Outpatient evaluation.  Patient complaining of headache with frontal sinus congestion.  We will prescribe Flonase.  DVT Prophylaxis: SCDs    Code Status: Full code Family Communication: Discussed with the patient Disposition Plan: Mobilize.  Wait on final sensitivities.  Anticipate discharge tomorrow.  He will need to be discharged on insulin.    LOS: 0 days   [facility] Pager [phone] 10/22/2018, 11:28 AM  If 7PM-7AM, please contact night-coverage at [email or web address], password [facility]

## 2018-10-23 DIAGNOSIS — N3001 Acute cystitis with hematuria: Secondary | ICD-10-CM

## 2018-10-23 DIAGNOSIS — E1165 Type 2 diabetes mellitus with hyperglycemia: Secondary | ICD-10-CM

## 2018-10-23 DIAGNOSIS — A419 Sepsis, unspecified organism: Secondary | ICD-10-CM

## 2018-10-23 DIAGNOSIS — N400 Enlarged prostate without lower urinary tract symptoms: Secondary | ICD-10-CM

## 2018-10-23 LAB — URINE CULTURE: Culture: >=100000 — AB

## 2018-10-23 LAB — BASIC METABOLIC PANEL
Anion gap: 9 (ref 5–15)
BUN: 8 mg/dL (ref 8–23)
CO2: 24 mmol/L (ref 22–32)
Calcium: 8.4 mg/dL — ABNORMAL LOW (ref 8.9–10.3)
Chloride: 100 mmol/L (ref 98–111)
Creatinine, Ser: 0.89 mg/dL (ref 0.61–1.24)
GFR calc Af Amer: >60 mL/min (ref >60)
GFR calc non Af Amer: >60 mL/min (ref >60)
Glucose, Bld: 231 mg/dL — ABNORMAL HIGH (ref 70–99)
Potassium: 3.6 mmol/L (ref 3.5–5.1)
Sodium: 133 mmol/L — ABNORMAL LOW (ref 135–145)

## 2018-10-23 LAB — CBC
HCT: 41 % (ref 39.0–52.0)
Hemoglobin: 13.5 g/dL (ref 13.0–17.0)
MCH: 28.7 pg (ref 26.0–34.0)
MCHC: 32.9 g/dL (ref 30.0–36.0)
MCV: 87 fL (ref 80.0–100.0)
NRBC: 0 % (ref 0.0–0.2)
Platelets: 216 10*3/uL (ref 150–400)
RBC: 4.71 MIL/uL (ref 4.22–5.81)
RDW: 13.9 % (ref 11.5–15.5)
WBC: 17.5 10*3/uL — ABNORMAL HIGH (ref 4.0–10.5)

## 2018-10-23 LAB — GLUCOSE, CAPILLARY
GLUCOSE-CAPILLARY: 259 mg/dL — AB (ref 70–99)
Glucose-Capillary: 212 mg/dL — ABNORMAL HIGH (ref 70–99)
Glucose-Capillary: 217 mg/dL — ABNORMAL HIGH (ref 70–99)

## 2018-10-23 LAB — MAGNESIUM: Magnesium: 1.8 mg/dL (ref 1.7–2.4)

## 2018-10-23 LAB — T3, FREE: T3, Free: 1.2 pg/mL — ABNORMAL LOW (ref 2.0–4.4)

## 2018-10-23 MED ORDER — LEVOTHYROXINE SODIUM 50 MCG PO TABS: 50.0000 ug | ORAL_TABLET | Freq: Every day | ORAL | 0 refills | Status: DC

## 2018-10-23 MED ORDER — INSULIN GLARGINE 100 UNIT/ML ~~LOC~~ SOLN: 20.0000 [IU] | Freq: Every day | SUBCUTANEOUS | 1 refills | Status: DC

## 2018-10-23 MED ORDER — INSULIN GLARGINE 100 UNIT/ML ~~LOC~~ SOLN
10.0000 [IU] | Freq: Once | SUBCUTANEOUS | Status: AC
  Administered 2018-10-23: 10 [IU] via SUBCUTANEOUS
  Filled 2018-10-23: qty 0.1

## 2018-10-23 MED ORDER — CEFUROXIME AXETIL 500 MG PO TABS: 500.0000 mg | ORAL_TABLET | Freq: Two times a day (BID) | ORAL | 0 refills | Status: DC

## 2018-10-23 MED ORDER — SULFAMETHOXAZOLE-TRIMETHOPRIM 800-160 MG PO TABS: 1.0000 | ORAL_TABLET | Freq: Two times a day (BID) | ORAL | 0 refills | Status: DC

## 2018-10-23 MED ORDER — BLOOD GLUCOSE METER KIT: PACK | 0 refills | Status: AC

## 2018-10-23 MED ORDER — METFORMIN HCL 500 MG PO TABS: 500.0000 mg | ORAL_TABLET | Freq: Two times a day (BID) | ORAL | 0 refills | Status: DC

## 2018-10-23 MED ORDER — INSULIN STARTER KIT- PEN NEEDLES (ENGLISH)
1.0000 | Freq: Once | Status: AC
  Administered 2018-10-23: 1
  Filled 2018-10-23: qty 1

## 2018-10-23 NOTE — Progress Notes (Addendum)
Inpatient Diabetes Program Recommendations  AACE/ADA: New Consensus Statement on Inpatient Glycemic Control (2015)  Target Ranges:  Prepandial:   less than 140 mg/dL      Peak postprandial:   less than 180 mg/dL (1-2 hours)      Critically ill patients:  140 - 180 mg/dL   Lab Results  Component Value Date   GLUCAP 212 (H) 10/23/2018   HGBA1C 11.8 (H) 10/20/2018    Review of Glycemic Control Results for SALLIE, MAKER (MRN [id number]) as of 10/23/2018 10:26  Ref. Range 10/22/2018 16:20 10/22/2018 20:07 10/22/2018 22:24 10/23/2018 00:08 10/23/2018 07:58  Glucose-Capillary Latest Ref Range: 70 - 99 mg/dL 323 (H) No correction given 225 (H) 236 (H) 2 units Novolog 217 (H) 212 (H)    Inpatient Diabetes Program Recommendations:   Spoke with RN Hulan Amato regarding plan for patient education. Patient successfully administered his insulin injection in preparation If discharged home on insulin. Insulin starter kit ordered. 11:45 Met with Patient and wife @ bedside. Patient states he feels confident in giving himself injections by syringe. Discussed discharge plans with Dr. Sarajane Jews and agree with plans. Patient and wife to attend outpatient diabetes classes @ [facility].  Thank you, Nani Gasser. Trinia Georgi, RN, MSN, CDE  Diabetes Coordinator Inpatient Glycemic Control Team Team Pager [phone] (8am-5pm) 10/23/2018 10:38 AM

## 2018-10-23 NOTE — Progress Notes (Signed)
Pt demonstrated correct technique of administering insulin to himself.

## 2018-10-23 NOTE — Progress Notes (Signed)
Pt discharged to home accompanied by wife with DC instructions given and reviewed.  Rx for meter given for pt to get at pharmacy.  All Rxs and follow up appointments reviewed with pt and wife.  Pt demonstrated correct technique of administration of insulin, rotation of sites reviewed with pt.  S/S of hypo and hyper glycemia reviewed and pt/wife were able to view the diabetes educational videos on the educ channel here.  Dietician saw pt and educated pt /wife on carb modified diet.  No further questions about home self care.

## 2018-10-23 NOTE — Progress Notes (Signed)
Living with Diabetes booklet given to pt and wife and list of diabetic videos given to them.  They are beginning to watch the videos now.

## 2018-10-23 NOTE — Discharge Summary (Addendum)
Physician Discharge Summary  [name] TIR:[id number] DOB: 24-Nov-1950 DOA: 10/20/2018  PCP: Aletha Halim., PA-C  Admit date: 10/20/2018 Discharge date: 10/23/2018  Recommendations for Outpatient Follow-up:   Diabetes mellitus type 2 uncontrolled with hemoglobin A1c 11.8.  Not on any outpatient medications. --Started on Lantus, titrate as appropriate --Started on metformin  Hypothyroidism, stop taking medications years ago.  TSH 45.5. --Levothyroxine started at 50 mcg.    Suggest recheck TSH in 4 weeks.  Enlarged prostate on CT, PSA 16; small right renal cysts --Recommend outpatient evaluation with urology  Follow-up Information    Aletha Halim., PA-C. Schedule an appointment as soon as possible for a visit in 1 week(s).   Specialty:  Family Medicine Contact information: [place] [address] [phone]            Discharge Diagnoses: Principal diagnosis is #1 1. Sepsis secondary to Serratia hemorrhagic cystitis (UTI with hematuria) 2. Diabetes mellitus type 2 with hyperglycemia, uncontrolled with hemoglobin A1c 11.8 3. Hypothyroidism 4. Enlarged prostate on CT, PSA 16; small right renal cysts  Discharge Condition: improved Disposition: home  Diet recommendation: diabetic diet  Filed Weights   10/20/18 1955  Weight: 107.5 kg    History of present illness:  67 year old man PMH CAD, diabetes mellitus type 2, hypothyroidism, not on medications for about 5 years, presented with 3 weeks of dysuria, hematuria, chills at home.  Admitted for sepsis secondary to UTI with hematuria.  Treated empirically with ceftriaxone.  Other issues included elevated PSA, poorly controlled diabetes and hypothyroidism.  Hospital Course:  Patient was treated with empiric antibiotics with rapid clinical improvement.  After discussion with pharmacy, best choice for transition to oral therapy is Bactrim as sensitivity to Ceftin cannot be predicted given  Serratia.  Patient was started on treatment for hypothyroidism as well as diabetes mellitus type 2 given education.  He was able to inject himself with insulin and has a PCP for outpatient follow-up.  He was instructed to keep a log.  Sepsis secondary to Serratia hemorrhagic cystitis (UTI with hematuria). --WBC trending down, afebrile, normotensive. --Doing very well, voiding without difficulty, hematuria has resolved, minimal pain.  Stable for discharge home on oral Bactrim.  Diabetes mellitus type 2 uncontrolled with hemoglobin A1c 11.8.  Not on any outpatient medications. --Started on Lantus, increased to 10 units today. CBG 186-323.  Fasting blood sugar 212.  Will increase to Lantus 20 units on discharge. Start metformin. --17 units NovoLog given last 24 hours --Blood glucose meter kit (includes lancets & strips) # [id number] prescription given  Hypothyroidism, stop taking medications years ago.  TSH 45.5. --Levothyroxine started at 50 mcg.  Recheck TSH in 4 weeks.  Hypokalemia and hypomagnesemia --Potassium within normal limits, magnesium within normal limits  Enlarged prostate on CT, PSA 16; small right renal cysts --Outpatient evaluation with urology  Today's assessment: S: feels great, no complaints, no hematuria, decreasing pain, no difficulty urinating. Eating well. Gave self insulin injection this AM. O: Vitals:  Vitals:   10/22/18 2009 10/23/18 0547  BP: 126/74 131/81  Pulse: 90 77  Resp: 18 14  Temp: 98.7 F (37.1 C) 99 F (37.2 C)  SpO2: 99% 96%    Constitutional:  . Appears calm and comfortable Eyes:  . pupils and irises appear normal ENMT:  . grossly normal hearing  Respiratory:  . CTA bilaterally, no w/r/r.  . Respiratory effort normal. Cardiovascular:  . RRR, no m/r/g . No LE extremity edema   Abdomen:  .  Soft, mild suprapubic pain Psychiatric:  . Mental status o Mood, affect appropriate  Antimicrobials:  Ceftriaxone 12/28 > 12/30  Bactrim  12/31 > 1/6  WBC trending down, 17.5.  BMP stable with sodium of 133.  Creatinine within normal limits.  Magnesium within normal limits.  Discharge Instructions  Discharge Instructions    Activity as tolerated - No restrictions   Complete by:  As directed    Diet Carb Modified   Complete by:  As directed    Discharge instructions   Complete by:  As directed    Call your physician or seek immediate medical attention for fever, difficulty urinating, bleeding, pain, shortness of breath, blood sugar over 400 or less than 70, or worsening of condition.     Allergies as of 10/23/2018   No Known Allergies     Medication List    STOP taking these medications   HYDROcodone-acetaminophen 5-325 MG tablet Commonly known as:  NORCO/VICODIN     TAKE these medications   blood glucose meter kit and supplies Dispense based on patient and insurance preference. Use up to four times daily as directed. (FOR ICD-10 E10.9, E11.9). What changed:    medication strength  See the new instructions.   insulin glargine 100 UNIT/ML injection Commonly known as:  LANTUS Inject 0.2 mLs (20 Units total) into the skin daily. Start taking on:  October 24, 2018   levothyroxine 50 MCG tablet Commonly known as:  SYNTHROID, LEVOTHROID Take 1 tablet (50 mcg total) by mouth daily at 6 (six) AM. Start taking on:  October 24, 2018   metFORMIN 500 MG tablet Commonly known as:  GLUCOPHAGE Take 1 tablet (500 mg total) by mouth 2 (two) times daily with a meal.   sulfamethoxazole-trimethoprim 800-160 MG tablet Commonly known as:  BACTRIM DS,SEPTRA DS Take 1 tablet by mouth 2 (two) times daily.      No Known Allergies  The results of significant diagnostics from this hospitalization (including imaging, microbiology, ancillary and laboratory) are listed below for reference.    Significant Diagnostic Studies: Ct Abdomen Pelvis W Contrast  Result Date: 10/20/2018 CLINICAL DATA:  Acute onset of suprapubic  abdominal pain, dysuria and hematuria. EXAM: CT ABDOMEN AND PELVIS WITH CONTRAST TECHNIQUE: Multidetector CT imaging of the abdomen and pelvis was performed using the standard protocol following bolus administration of intravenous contrast. CONTRAST:  177m OMNIPAQUE IOHEXOL 300 MG/ML  SOLN COMPARISON:  CT of the abdomen and pelvis performed 04/14/2008, and abdominal ultrasound performed 04/15/2008 FINDINGS: Lower chest: The visualized lung bases are grossly clear. The visualized portions of the mediastinum are unremarkable. Hepatobiliary: A calcified granuloma is noted within the liver. The gallbladder is unremarkable in appearance. The common bile duct is normal in caliber. Pancreas: The pancreas is within normal limits. Spleen: The spleen is unremarkable in appearance. Adrenals/Urinary Tract: The adrenal glands are unremarkable in appearance. Small right renal cysts are noted. Mild nonspecific perinephric stranding is noted bilaterally. There is no evidence of hydronephrosis. No renal or ureteral stones are identified. No definite filling defects are seen on delayed images. Stomach/Bowel: The stomach is unremarkable in appearance. The small bowel is within normal limits. The appendix is normal in caliber, without evidence of appendicitis. The colon is unremarkable in appearance. Vascular/Lymphatic: The abdominal aorta is unremarkable in appearance. The inferior vena cava is grossly unremarkable. No retroperitoneal lymphadenopathy is seen. No pelvic sidewall lymphadenopathy is identified. Reproductive: The bladder is mildly distended. Mild soft tissue inflammation about the bladder could reflect cystitis. High attenuation  within the bladder may reflect clinically described hematuria. The prostate is enlarged, measuring 5.5 cm in transverse dimension, with scattered calcification and heterogeneity. Other: No additional soft tissue abnormalities are seen. Musculoskeletal: No acute osseous abnormalities are  identified. The visualized musculature is unremarkable in appearance. IMPRESSION: 1. Mild soft tissue inflammation about the bladder could reflect cystitis. High attenuation within the bladder may reflect clinically described hematuria. 2. Enlarged prostate, with scattered calcification and heterogeneity. Would correlate with PSA. 3. Small right renal cysts noted. Electronically Signed   By: Garald Balding M.D.   On: 10/20/2018 22:15    Microbiology: Recent Results (from the past 240 hour(s))  Urine culture     Status: Abnormal   Collection Time: 10/20/18  6:59 PM  Result Value Ref Range Status   Specimen Description URINE, RANDOM  Final   Special Requests   Final    NONE Performed at [facility], [address], [place], [place] [address]    Culture >=100,000 COLONIES/mL SERRATIA MARCESCENS (A)  Final   Report Status 10/23/2018 FINAL  Final   Organism ID, Bacteria SERRATIA MARCESCENS (A)  Final      Susceptibility   Serratia marcescens - MIC*    CEFAZOLIN >=64 RESISTANT Resistant     CEFTRIAXONE <=1 SENSITIVE Sensitive     CIPROFLOXACIN <=0.25 SENSITIVE Sensitive     GENTAMICIN <=1 SENSITIVE Sensitive     NITROFURANTOIN 256 RESISTANT Resistant     TRIMETH/SULFA <=20 SENSITIVE Sensitive     * >=100,000 COLONIES/mL SERRATIA MARCESCENS  Blood culture (routine x 2)     Status: None (Preliminary result)   Collection Time: 10/20/18 10:24 PM  Result Value Ref Range Status   Specimen Description BLOOD LEFT ANTECUBITAL  Final   Special Requests   Final    BOTTLES DRAWN AEROBIC AND ANAEROBIC Blood Culture results may not be optimal due to an inadequate volume of blood received in culture bottles   Culture   Final    NO GROWTH 3 DAYS Performed at [facility], [place] [address] [place], [place] [address]    Report Status PENDING  Incomplete  Blood culture (routine x 2)     Status: None (Preliminary result)   Collection Time: 10/20/18 10:29 PM  Result Value Ref Range Status    Specimen Description BLOOD LEFT FOREARM  Final   Special Requests   Final    BOTTLES DRAWN AEROBIC AND ANAEROBIC Blood Culture results may not be optimal due to an inadequate volume of blood received in culture bottles   Culture   Final    NO GROWTH 3 DAYS Performed at [facility], [place] [address], [place],  [address]    Report Status PENDING  Incomplete     Labs: Basic Metabolic Panel: Recent Labs  Lab 10/20/18 1913 10/20/18 2002 10/21/18 0453 10/22/18 0148 10/23/18 0602  NA 130* 131* 133* 133* 133*  K 4.2 3.7 3.2* 3.5 3.6  CL 94* 94* 98 103 100  CO2  --  '24 23 22 24  ' GLUCOSE 324* 321* 238* 248* 231*  BUN '12 12 9 9 8  ' CREATININE 0.80 1.14 1.02 0.95 0.89  CALCIUM  --  8.9 8.2* 8.1* 8.4*  MG  --   --   --  1.6* 1.8   Liver Function Tests: Recent Labs  Lab 10/20/18 2002  AST 34  ALT 34  ALKPHOS 75  BILITOT 2.3*  PROT 7.1  ALBUMIN 3.8   Recent Labs  Lab  10/20/18 2002  LIPASE 24   CBC: Recent Labs  Lab 10/20/18 1913 10/20/18 2002 10/21/18 0453 10/22/18 0148 10/23/18 0602  WBC  --  27.2* 24.4* 20.8* 17.5*  HGB 16.3 15.3 13.5 13.1 13.5  HCT 48.0 45.0 40.2 38.4* 41.0  MCV  --  85.6 85.0 85.1 87.0  PLT  --  214 191 181 216    CBG: Recent Labs  Lab 10/22/18 1620 10/22/18 2007 10/22/18 2224 10/23/18 0008 10/23/18 0758  GLUCAP 323* 225* 236* 217* 212*    Principal Problem:   Acute cystitis with hematuria Active Problems:   Diabetes mellitus ([place])   Prostate enlargement   Time coordinating discharge: 35 minutes  Signed:  Murray Hodgkins, MD [facility] 10/23/2018, 11:57 AM

## 2018-10-23 NOTE — Plan of Care (Signed)
Nutrition Education Note  RD consulted for nutrition education regarding diabetes.   Lab Results  Component Value Date   HGBA1C 11.8 (H) 10/20/2018   Spoke with pt and wife at bedside. Pt reports typically eating 2 meals daily: large breakfast and large supper. A typical meal includes large portions of rice, beans, green beans, and some sort of meat. Pt states, "I can eat the whole pot of grits." Pt also reports drinking half and half sweet tea and soda throughout the day.  RD provided "Carbohydrate Counting for People with Diabetes" handout from the Academy of Nutrition and Dietetics. Discussed different food groups and their effects on blood sugar, emphasizing carbohydrate-containing foods. Provided list of carbohydrates and recommended serving sizes of common foods.  Discussed importance of controlled and consistent carbohydrate intake throughout the day. Provided examples of ways to balance meals/snacks and encouraged intake of high-fiber, whole grain complex carbohydrates. Teach back method used.  Expect good compliance.  Body mass index is 33.05 kg/m. Pt meets criteria for obesity class I based on current BMI.  Current diet order is Carb Mod, patient is consuming approximately 100% of meals at this time. Labs and medications reviewed. No further nutrition interventions warranted at this time. RD contact information provided. If additional nutrition issues arise, please re-consult RD.   Gaynell Face, MS, RD, LDN Inpatient Clinical Dietitian Pager: [phone] Weekend/After Hours: [phone]

## 2018-10-23 NOTE — Plan of Care (Signed)
  Problem: Health Behavior/Discharge Planning: Goal: Ability to manage health-related needs will improve Outcome: Progressing   Problem: Clinical Measurements: Goal: Will remain free from infection Outcome: Progressing Goal: Diagnostic test results will improve Outcome: Progressing   Problem: Activity: Goal: Risk for activity intolerance will decrease Outcome: Progressing   Problem: Nutrition: Goal: Adequate nutrition will be maintained Outcome: Progressing   Problem: Coping: Goal: Level of anxiety will decrease Outcome: Progressing   Problem: Pain Managment: Goal: General experience of comfort will improve Outcome: Progressing   Problem: Safety: Goal: Ability to remain free from injury will improve Outcome: Progressing   Problem: Skin Integrity: Goal: Risk for impaired skin integrity will decrease Outcome: Progressing   

## 2018-10-25 LAB — CULTURE, BLOOD (ROUTINE X 2)
Culture: NO GROWTH
Culture: NO GROWTH

## 2018-10-26 ENCOUNTER — Telehealth (HOSPITAL_COMMUNITY): Payer: Self-pay | Admitting: Emergency Medicine

## 2018-10-26 NOTE — Telephone Encounter (Signed)
Urine culture was positive for SERRATIA MARCESCENS and was given IV Rocephin at the ER and bactrim PO to go home.. Pt contacted and made aware, educated on completing antibiotic and to follow up if symptoms are persistent. Verbalized understanding.

## 2018-11-05 DIAGNOSIS — E7800 Pure hypercholesterolemia, unspecified: Secondary | ICD-10-CM | POA: Diagnosis not present

## 2018-11-05 DIAGNOSIS — R339 Retention of urine, unspecified: Secondary | ICD-10-CM | POA: Diagnosis not present

## 2018-11-05 DIAGNOSIS — N4283 Cyst of prostate: Secondary | ICD-10-CM | POA: Diagnosis not present

## 2018-11-05 DIAGNOSIS — E039 Hypothyroidism, unspecified: Secondary | ICD-10-CM | POA: Diagnosis not present

## 2018-11-05 DIAGNOSIS — E1165 Type 2 diabetes mellitus with hyperglycemia: Secondary | ICD-10-CM | POA: Diagnosis not present

## 2018-11-05 DIAGNOSIS — R972 Elevated prostate specific antigen [PSA]: Secondary | ICD-10-CM | POA: Diagnosis not present

## 2018-11-05 DIAGNOSIS — N400 Enlarged prostate without lower urinary tract symptoms: Secondary | ICD-10-CM | POA: Diagnosis not present

## 2018-11-05 DIAGNOSIS — N3001 Acute cystitis with hematuria: Secondary | ICD-10-CM | POA: Diagnosis not present

## 2018-12-25 DIAGNOSIS — R972 Elevated prostate specific antigen [PSA]: Secondary | ICD-10-CM | POA: Diagnosis not present

## 2019-01-30 ENCOUNTER — Encounter: Payer: Self-pay | Admitting: *Deleted

## 2019-01-31 DIAGNOSIS — R972 Elevated prostate specific antigen [PSA]: Secondary | ICD-10-CM | POA: Diagnosis not present

## 2019-02-07 DIAGNOSIS — R351 Nocturia: Secondary | ICD-10-CM | POA: Diagnosis not present

## 2019-02-07 DIAGNOSIS — R972 Elevated prostate specific antigen [PSA]: Secondary | ICD-10-CM | POA: Diagnosis not present

## 2019-03-15 ENCOUNTER — Other Ambulatory Visit: Payer: Self-pay | Admitting: *Deleted

## 2019-03-15 NOTE — Patient Outreach (Signed)
[place] [facility]) Care Management  03/15/2019  [name] 1951/08/23 [id number]   Follow up from HRA screening phone call attempt on 4/6 and 4/8, unsuccessful outreach . Patient was mailed unsuccessful letter on 4/8.   Patient to be followed in the HTA , Health risk assessment engaged program.   Plan Will schedule return call in the next 2 months from initial outreach attempt.    Joylene Draft, RN, [place] Management Coordinator  [phone]- Mobile [phone]- Toll Free Main Office

## 2019-03-29 ENCOUNTER — Ambulatory Visit: Payer: Medicare Other | Admitting: *Deleted

## 2019-04-10 ENCOUNTER — Other Ambulatory Visit: Payer: Self-pay | Admitting: *Deleted

## 2019-04-10 NOTE — Patient Outreach (Signed)
[facility]) Care Management  04/10/2019  [name] 12/22/1950 [id number]   Patient  case has been transitioned to [facility] to continue Care Management services.    Joylene Draft, RN, [place] Management Coordinator  [phone]- Mobile [phone]- Toll Free Main Office

## 2019-04-24 ENCOUNTER — Ambulatory Visit: Payer: PPO | Admitting: *Deleted

## 2019-08-13 DIAGNOSIS — R972 Elevated prostate specific antigen [PSA]: Secondary | ICD-10-CM | POA: Diagnosis not present

## 2019-08-20 DIAGNOSIS — R972 Elevated prostate specific antigen [PSA]: Secondary | ICD-10-CM | POA: Diagnosis not present

## 2019-08-20 DIAGNOSIS — R351 Nocturia: Secondary | ICD-10-CM | POA: Diagnosis not present

## 2019-08-21 DIAGNOSIS — J069 Acute upper respiratory infection, unspecified: Secondary | ICD-10-CM | POA: Diagnosis not present

## 2019-08-21 DIAGNOSIS — U071 COVID-19: Secondary | ICD-10-CM | POA: Diagnosis not present

## 2019-08-21 DIAGNOSIS — J0140 Acute pansinusitis, unspecified: Secondary | ICD-10-CM | POA: Diagnosis not present

## 2019-10-30 DIAGNOSIS — R972 Elevated prostate specific antigen [PSA]: Secondary | ICD-10-CM | POA: Diagnosis not present

## 2019-10-30 DIAGNOSIS — E785 Hyperlipidemia, unspecified: Secondary | ICD-10-CM | POA: Diagnosis not present

## 2019-10-30 DIAGNOSIS — E039 Hypothyroidism, unspecified: Secondary | ICD-10-CM | POA: Diagnosis not present

## 2019-10-30 DIAGNOSIS — Z23 Encounter for immunization: Secondary | ICD-10-CM | POA: Diagnosis not present

## 2019-10-30 DIAGNOSIS — E1165 Type 2 diabetes mellitus with hyperglycemia: Secondary | ICD-10-CM | POA: Diagnosis not present

## 2019-10-30 DIAGNOSIS — Z0000 Encounter for general adult medical examination without abnormal findings: Secondary | ICD-10-CM | POA: Diagnosis not present

## 2020-01-07 DIAGNOSIS — E119 Type 2 diabetes mellitus without complications: Secondary | ICD-10-CM | POA: Diagnosis not present

## 2020-01-27 DIAGNOSIS — H2513 Age-related nuclear cataract, bilateral: Secondary | ICD-10-CM | POA: Diagnosis not present

## 2020-01-27 DIAGNOSIS — H11043 Peripheral pterygium, stationary, bilateral: Secondary | ICD-10-CM | POA: Diagnosis not present

## 2020-01-27 DIAGNOSIS — E119 Type 2 diabetes mellitus without complications: Secondary | ICD-10-CM | POA: Diagnosis not present

## 2020-01-27 DIAGNOSIS — H353131 Nonexudative age-related macular degeneration, bilateral, early dry stage: Secondary | ICD-10-CM | POA: Diagnosis not present

## 2020-01-31 DIAGNOSIS — R972 Elevated prostate specific antigen [PSA]: Secondary | ICD-10-CM | POA: Diagnosis not present

## 2020-02-03 DIAGNOSIS — E039 Hypothyroidism, unspecified: Secondary | ICD-10-CM | POA: Diagnosis not present

## 2020-02-03 DIAGNOSIS — E785 Hyperlipidemia, unspecified: Secondary | ICD-10-CM | POA: Diagnosis not present

## 2020-02-03 DIAGNOSIS — E1165 Type 2 diabetes mellitus with hyperglycemia: Secondary | ICD-10-CM | POA: Diagnosis not present

## 2020-02-21 DIAGNOSIS — H2511 Age-related nuclear cataract, right eye: Secondary | ICD-10-CM | POA: Diagnosis not present

## 2020-02-21 DIAGNOSIS — H52221 Regular astigmatism, right eye: Secondary | ICD-10-CM | POA: Diagnosis not present

## 2020-02-21 DIAGNOSIS — H25811 Combined forms of age-related cataract, right eye: Secondary | ICD-10-CM | POA: Diagnosis not present

## 2020-03-13 DIAGNOSIS — R972 Elevated prostate specific antigen [PSA]: Secondary | ICD-10-CM | POA: Diagnosis not present

## 2020-03-20 DIAGNOSIS — R972 Elevated prostate specific antigen [PSA]: Secondary | ICD-10-CM | POA: Diagnosis not present

## 2020-03-20 DIAGNOSIS — R351 Nocturia: Secondary | ICD-10-CM | POA: Diagnosis not present

## 2020-03-27 ENCOUNTER — Other Ambulatory Visit: Payer: Self-pay

## 2020-03-27 DIAGNOSIS — R972 Elevated prostate specific antigen [PSA]: Secondary | ICD-10-CM

## 2020-04-01 DIAGNOSIS — H2512 Age-related nuclear cataract, left eye: Secondary | ICD-10-CM | POA: Diagnosis not present

## 2020-04-03 DIAGNOSIS — H52222 Regular astigmatism, left eye: Secondary | ICD-10-CM | POA: Diagnosis not present

## 2020-04-03 DIAGNOSIS — H25812 Combined forms of age-related cataract, left eye: Secondary | ICD-10-CM | POA: Diagnosis not present

## 2020-04-03 DIAGNOSIS — H2512 Age-related nuclear cataract, left eye: Secondary | ICD-10-CM | POA: Diagnosis not present

## 2020-11-27 DIAGNOSIS — E039 Hypothyroidism, unspecified: Secondary | ICD-10-CM | POA: Diagnosis not present

## 2020-11-27 DIAGNOSIS — E7800 Pure hypercholesterolemia, unspecified: Secondary | ICD-10-CM | POA: Diagnosis not present

## 2020-11-27 DIAGNOSIS — Z0000 Encounter for general adult medical examination without abnormal findings: Secondary | ICD-10-CM | POA: Diagnosis not present

## 2020-11-27 DIAGNOSIS — Z1159 Encounter for screening for other viral diseases: Secondary | ICD-10-CM | POA: Diagnosis not present

## 2020-11-27 DIAGNOSIS — E1165 Type 2 diabetes mellitus with hyperglycemia: Secondary | ICD-10-CM | POA: Diagnosis not present

## 2020-12-07 ENCOUNTER — Encounter: Payer: Self-pay | Admitting: Gastroenterology

## 2021-02-02 DIAGNOSIS — H353131 Nonexudative age-related macular degeneration, bilateral, early dry stage: Secondary | ICD-10-CM | POA: Diagnosis not present

## 2021-02-02 DIAGNOSIS — H11043 Peripheral pterygium, stationary, bilateral: Secondary | ICD-10-CM | POA: Diagnosis not present

## 2021-02-02 DIAGNOSIS — Z961 Presence of intraocular lens: Secondary | ICD-10-CM | POA: Diagnosis not present

## 2021-02-02 DIAGNOSIS — E119 Type 2 diabetes mellitus without complications: Secondary | ICD-10-CM | POA: Diagnosis not present

## 2021-02-10 ENCOUNTER — Ambulatory Visit: Payer: PPO

## 2021-02-10 ENCOUNTER — Telehealth: Payer: Self-pay

## 2021-02-10 NOTE — Telephone Encounter (Signed)
Pt was NS for PV.  Attempted to reach pt x 2 without success.  LVMM requesting a call back to r/S PV by 5:00 today to prevent cancellation of procedure.

## 2021-02-10 NOTE — Telephone Encounter (Signed)
Pt did not call to reschedule PV by 5:00 pm.  Procedure cancelled with NS letter mailed.

## 2021-02-16 ENCOUNTER — Telehealth: Payer: Self-pay

## 2021-02-16 NOTE — Telephone Encounter (Signed)
[name],  For the highlighted procedure this pt was not intubated.  Instead they used a laryngeal mask airway (LMA); which was correctly  Positioned on the second attempt.  This pt is cleared for anesthetic care at [facility].  Thanks,  Osvaldo Angst

## 2021-02-16 NOTE — Telephone Encounter (Signed)
[name]:  Pt had 2 attempts so I am not sure about his intubation status.  Below is what the chart said:  Thanks for your review.   Procedure Name: LMA Insertion Date/Time: 09/07/2018 7:58 AM Performed by: Raenette Rover, CRNA Pre-anesthesia Checklist: Patient identified, Emergency Drugs available, Suction available and Patient being monitored Patient Re-evaluated:Patient Re-evaluated prior to induction Oxygen Delivery Method: Circle system utilized Preoxygenation: Pre-oxygenation with 100% oxygen Induction Type: IV induction LMA: LMA inserted LMA Size: 4.0 Number of attempts: 2 Placement Confirmation: positive ETCO2,  CO2 detector and breath sounds checked- equal and bilateral Tube secured with: Tape Dental Injury: Teeth and Oropharynx as per pre-operative assessment

## 2021-02-17 ENCOUNTER — Other Ambulatory Visit: Payer: Self-pay

## 2021-02-17 ENCOUNTER — Ambulatory Visit: Payer: PPO

## 2021-02-17 DIAGNOSIS — Z1211 Encounter for screening for malignant neoplasm of colon: Secondary | ICD-10-CM

## 2021-02-17 NOTE — Progress Notes (Signed)
Pt verified name, DOB, address and insurance during PV today.   Pt mailed instruction packet to included copy of consent form to read and not return, and instructions.  PV completed over the phone. Pt encouraged to call with questions or issues.   No allergies to soy or egg Pt is not on blood thinners or diet pills Denies issues with sedation/intubation Denies atrial flutter/fib Denies constipation   Pt is aware of Covid safety and care partner requirements.   

## 2021-02-18 ENCOUNTER — Encounter: Payer: Self-pay | Admitting: Gastroenterology

## 2021-02-23 ENCOUNTER — Encounter: Payer: Self-pay | Admitting: Gastroenterology

## 2021-02-23 ENCOUNTER — Ambulatory Visit (AMBULATORY_SURGERY_CENTER): Payer: PPO | Admitting: Gastroenterology

## 2021-02-23 ENCOUNTER — Other Ambulatory Visit: Payer: Self-pay

## 2021-02-23 ENCOUNTER — Encounter: Payer: PPO | Admitting: Gastroenterology

## 2021-02-23 VITALS — BP 98/68 | HR 75 | Temp 96.8°F | Resp 32 | Ht 71.0 in

## 2021-02-23 DIAGNOSIS — D122 Benign neoplasm of ascending colon: Secondary | ICD-10-CM | POA: Diagnosis not present

## 2021-02-23 DIAGNOSIS — Z1211 Encounter for screening for malignant neoplasm of colon: Secondary | ICD-10-CM

## 2021-02-23 DIAGNOSIS — D124 Benign neoplasm of descending colon: Secondary | ICD-10-CM

## 2021-02-23 DIAGNOSIS — D127 Benign neoplasm of rectosigmoid junction: Secondary | ICD-10-CM

## 2021-02-23 MED ORDER — SODIUM CHLORIDE 0.9 % IV SOLN: 500.0000 mL | Freq: Once | INTRAVENOUS | Status: DC

## 2021-02-23 NOTE — Progress Notes (Signed)
VS by MO   I have reviewed the patient's medical history in detail and updated the computerized patient record.  

## 2021-02-23 NOTE — Op Note (Signed)
[place] Patient Name: [name] Procedure Date: 02/23/2021 8:13 AM MRN: [id number] Endoscopist: Justice Britain , MD Age: 70 Referring MD:  Date of Birth: September 16, 1951 Gender: Male Account #: [id number] Procedure:                Colonoscopy Indications:              Screening for colorectal malignant neoplasm Medicines:                Monitored Anesthesia Care Procedure:                Pre-Anesthesia Assessment:                           - Prior to the procedure, a History and Physical                            was performed, and patient medications and                            allergies were reviewed. The patient's tolerance of                            previous anesthesia was also reviewed. The risks                            and benefits of the procedure and the sedation                            options and risks were discussed with the patient.                            All questions were answered, and informed consent                            was obtained. Prior Anticoagulants: The patient has                            taken no previous anticoagulant or antiplatelet                            agents except for aspirin. ASA Grade Assessment:                            III - A patient with severe systemic disease. After                            reviewing the risks and benefits, the patient was                            deemed in satisfactory condition to undergo the                            procedure.  After obtaining informed consent, the colonoscope                            was passed under direct vision. Throughout the                            procedure, the patient's blood pressure, pulse, and                            oxygen saturations were monitored continuously. The                            Olympus CF-HQ190L [id number]) Colonoscope was                            introduced through the anus and advanced to the 5                             cm into the ileum. The colonoscopy was performed                            without difficulty. The patient tolerated the                            procedure. The quality of the bowel preparation was                            adequate. The terminal ileum, ileocecal valve,                            appendiceal orifice, and rectum were photographed. Scope In: 8:26:21 AM Scope Out: 8:48:31 AM Scope Withdrawal Time: 0 hours 18 minutes 32 seconds  Total Procedure Duration: 0 hours 22 minutes 10 seconds  Findings:                 The digital rectal exam was normal. Pertinent                            negatives include no palpable rectal lesions.                           Extensive amounts of liquid stool was found in the                            entire colon, interfering with visualization.                            Lavage of the area was performed using copious                            amounts, resulting in clearance with adequate                            visualization.  The terminal ileum and ileocecal valve appeared                            normal.                           There was a small lipoma, at the hepatic flexure.                           Four sessile polyps were found in the recto-sigmoid                            colon (1), descending colon (2) and ascending colon                            (1). The polyps were 3 to 6 mm in size. These                            polyps were removed with a cold snare. Resection                            and retrieval were complete.                           Multiple small-mouthed diverticula were found in                            the recto-sigmoid colon and sigmoid colon.                           Normal mucosa was found in the entire colon                            otherwise.                           Non-bleeding non-thrombosed internal hemorrhoids                             were found during retroflexion, during perianal                            exam and during digital exam. The hemorrhoids were                            Grade I (internal hemorrhoids that do not prolapse). Complications:            No immediate complications. Estimated Blood Loss:     Estimated blood loss was minimal. Impression:               - Stool in the entire examined colon.                           - The examined portion of the ileum was normal.                           -  Small lipoma at the hepatic flexure.                           - Four 3 to 6 mm polyps at the recto-sigmoid colon,                            in the descending colon and in the ascending colon,                            removed with a cold snare. Resected and retrieved.                           - Diverticulosis in the recto-sigmoid colon and in                            the sigmoid colon.                           - Normal mucosa in the entire examined colon                            otherwise.                           - Non-bleeding non-thrombosed internal hemorrhoids. Recommendation:           - The patient will be observed post-procedure,                            until all discharge criteria are met.                           - Discharge patient to home.                           - Patient has a contact number available for                            emergencies. The signs and symptoms of potential                            delayed complications were discussed with the                            patient. Return to normal activities tomorrow.                            Written discharge instructions were provided to the                            patient.                           - High fiber diet.                           -  Use FiberCon 1-2 tablets PO daily.                           - Continue present medications.                           - Await pathology results.                           -  Repeat colonoscopy in 3/02/27/09 years for                            surveillance based on pathology results and                            adenomatous tissue being found.                           - The findings and recommendations were discussed                            with the patient.                           - The findings and recommendations were discussed                            with the patient's family. Justice Britain, MD 02/23/2021 8:53:41 AM

## 2021-02-23 NOTE — Patient Instructions (Signed)
Handout given:  Polyps, diverticulosis, hemorrhoids, high fiber diet Start High Fiber Diet Use fibercon 1-2 tabs daily Continue current medications Await pathology results  YOU HAD AN ENDOSCOPIC PROCEDURE TODAY AT [place]:   Refer to the procedure report that was given to you for any specific questions about what was found during the examination.  If the procedure report does not answer your questions, please call your gastroenterologist to clarify.  If you requested that your care partner not be given the details of your procedure findings, then the procedure report has been included in a sealed envelope for you to review at your convenience later.  YOU SHOULD EXPECT: Some feelings of bloating in the abdomen. Passage of more gas than usual.  Walking can help get rid of the air that was put into your GI tract during the procedure and reduce the bloating. If you had a lower endoscopy (such as a colonoscopy or flexible sigmoidoscopy) you may notice spotting of blood in your stool or on the toilet paper. If you underwent a bowel prep for your procedure, you may not have a normal bowel movement for a few days.  Please Note:  You might notice some irritation and congestion in your nose or some drainage.  This is from the oxygen used during your procedure.  There is no need for concern and it should clear up in a day or so.  SYMPTOMS TO REPORT IMMEDIATELY:   Following lower endoscopy (colonoscopy or flexible sigmoidoscopy):  Excessive amounts of blood in the stool  Significant tenderness or worsening of abdominal pains  Swelling of the abdomen that is new, acute  Fever of 100F or higher  For urgent or emergent issues, a gastroenterologist can be reached at any hour by calling [phone]. Do not use MyChart messaging for urgent concerns.   DIET:  We do recommend a small meal at first, but then you may proceed to your regular diet.  Drink plenty of fluids but you should  avoid alcoholic beverages for 24 hours.  ACTIVITY:  You should plan to take it easy for the rest of today and you should NOT DRIVE or use heavy machinery until tomorrow (because of the sedation medicines used during the test).    FOLLOW UP: Our staff will call the number listed on your records 48-72 hours following your procedure to check on you and address any questions or concerns that you may have regarding the information given to you following your procedure. If we do not reach you, we will leave a message.  We will attempt to reach you two times.  During this call, we will ask if you have developed any symptoms of COVID 19. If you develop any symptoms (ie: fever, flu-like symptoms, shortness of breath, cough etc.) before then, please call [phone].  If you test positive for Covid 19 in the 2 weeks post procedure, please call and report this information to [place].    If any biopsies were taken you will be contacted by phone or by letter within the next 1-3 weeks.  Please call us at [phone] if you have not heard about the biopsies in 3 weeks.   SIGNATURES/CONFIDENTIALITY: You and/or your care partner have signed paperwork which will be entered into your electronic medical record.  These signatures attest to the fact that that the information above on your After Visit Summary has been reviewed and is understood.  Full responsibility of the confidentiality of this discharge information lies with  you and/or your care-partner.

## 2021-02-23 NOTE — Progress Notes (Signed)
pt tolerated well. VSS. awake and to recovery. Report given to RN.  

## 2021-02-23 NOTE — Progress Notes (Signed)
Called to room to assist during endoscopic procedure.  Patient ID and intended procedure confirmed with present staff. Received instructions for my participation in the procedure from the performing physician.  

## 2021-02-25 ENCOUNTER — Telehealth: Payer: Self-pay | Admitting: *Deleted

## 2021-02-25 NOTE — Telephone Encounter (Signed)
  Follow up Call-  Call back number 02/23/2021  Post procedure Call Back phone  # [phone]  Permission to leave phone message Yes  Some recent data might be hidden     Patient questions:  Do you have a fever, pain , or abdominal swelling? No. Pain Score  0 *  Have you tolerated food without any problems? Yes.    Have you been able to return to your normal activities? Yes.    Do you have any questions about your discharge instructions: Diet   No. Medications  No. Follow up visit  No.  Do you have questions or concerns about your Care? No.  Actions: * If pain score is 4 or above: 1. No action needed, pain <4.Have you developed a fever since your procedure? no  2.   Have you had an respiratory symptoms (SOB or cough) since your procedure? no  3.   Have you tested positive for COVID 19 since your procedure no  4.   Have you had any family members/close contacts diagnosed with the COVID 19 since your procedure?  no   If yes to any of these questions please route to Joylene John, RN and Joella Prince, RN

## 2021-03-03 ENCOUNTER — Telehealth: Payer: Self-pay | Admitting: Gastroenterology

## 2021-03-03 NOTE — Telephone Encounter (Signed)
Inbound call from patient wife, Lelan Pons, asking for results for labs from procedure 5/3. Best contact number [phone]

## 2021-03-03 NOTE — Telephone Encounter (Signed)
The pt has been advised that the path has not been reviewed.  We will contact them as soon as available.  The pt has been advised of the information and verbalized understanding.

## 2021-03-04 ENCOUNTER — Other Ambulatory Visit: Payer: PPO

## 2021-03-04 ENCOUNTER — Other Ambulatory Visit: Payer: Self-pay

## 2021-03-04 DIAGNOSIS — R972 Elevated prostate specific antigen [PSA]: Secondary | ICD-10-CM | POA: Diagnosis not present

## 2021-03-05 ENCOUNTER — Other Ambulatory Visit: Payer: PPO

## 2021-03-05 ENCOUNTER — Encounter: Payer: Self-pay | Admitting: Gastroenterology

## 2021-03-05 LAB — PSA: Prostate Specific Ag, Serum: 2.4 ng/mL (ref 0.0–4.0)

## 2021-03-11 ENCOUNTER — Ambulatory Visit: Payer: PPO | Admitting: Urology

## 2021-03-12 ENCOUNTER — Ambulatory Visit: Payer: PPO | Admitting: Urology

## 2021-03-12 ENCOUNTER — Encounter: Payer: Self-pay | Admitting: Urology

## 2021-03-12 ENCOUNTER — Other Ambulatory Visit: Payer: Self-pay

## 2021-03-12 VITALS — BP 153/68 | HR 90 | Temp 98.2°F | Ht 71.0 in

## 2021-03-12 DIAGNOSIS — R972 Elevated prostate specific antigen [PSA]: Secondary | ICD-10-CM | POA: Diagnosis not present

## 2021-03-12 DIAGNOSIS — R351 Nocturia: Secondary | ICD-10-CM

## 2021-03-12 LAB — URINALYSIS, ROUTINE W REFLEX MICROSCOPIC
Bilirubin, UA: NEGATIVE
Ketones, UA: NEGATIVE
Leukocytes,UA: NEGATIVE
Nitrite, UA: NEGATIVE
Protein,UA: NEGATIVE
Specific Gravity, UA: 1.02 (ref 1.005–1.030)
Urobilinogen, Ur: 0.2 mg/dL (ref 0.2–1.0)
pH, UA: 5.5 (ref 5.0–7.5)

## 2021-03-12 LAB — MICROSCOPIC EXAMINATION
Renal Epithel, UA: NONE SEEN /hpf
WBC, UA: NONE SEEN /hpf (ref 0–5)

## 2021-03-12 NOTE — Patient Instructions (Signed)

## 2021-03-12 NOTE — Progress Notes (Signed)
Urological Symptom Review  Patient is experiencing the following symptoms: None   Review of Systems  Gastrointestinal (upper)  : Negative for upper GI symptoms  Gastrointestinal (lower) : Negative for lower GI symptoms  Constitutional : Negative for symptoms  Skin: Negative for skin symptoms  Eyes: Negative for eye symptoms  Ear/Nose/Throat : Negative for Ear/Nose/Throat symptoms  Hematologic/Lymphatic: Negative for Hematologic/Lymphatic symptoms  Cardiovascular : Negative for cardiovascular symptoms  Respiratory : Negative for respiratory symptoms  Endocrine: Negative for endocrine symptoms  Musculoskeletal: Negative for musculoskeletal symptoms  Neurological: Headaches  Psychologic: Negative for psychiatric symptoms 

## 2021-03-12 NOTE — Progress Notes (Signed)
03/12/2021 2:02 PM   [name] November 11, 1950 [id number]  Referring provider: Aletha Halim., PA-C [address]  [place] [address]  followup PSA and nocturia  HPI: [name] is a 70yo here for followup for elevated PSa and nocturia. IPSS 4 QOl 0. Stream strong. Nocturia 0-1x PSA 2.4. No complaints today.  He has DMII and last A1c was 9.4.   His records from [facility] are as follows: My PSA is elevated above the normal range.  HPI: [name] is a 70 year-old male established patient who is here for an elevated PSA.  His PSA is 16. He has had PSA's drawn prior to this one. He has not had elevated PSA's prior to this one. He indicates there were no urinary problems when the PSA was drawn. He has not had a prostate nodule on a physical examination. He has not had recurrent prostate infections or chronic prostatitis.   He has been on antibiotics for prostate infections previously. He has not had a prostate biopsy done.   12/25/2018: He was hospitalized in December 2019 for gross hematuria, UTI and sepsis. A PSA drawn at that time was 16. Previous PSAs when he saw [name] in 2011 was 1.5-2. He is on Jardiance which was prescribed 1 month.   02/07/2019: PSa decreased to 3.4 from 16   08/20/2019: PSA decreased to 1.76   03/20/2020: PSA 2.2 and 1 month prior was 1.6.     CC: I get up too often at night to urinate.  HPI: He first noticed the symptom approximately 01/23/2016. He usually gets up at night to urinate 1 time. He does have nights when he does not get up to urinate at all. He does not have trouble falling back asleep once he has been woken up at night.   He does not usually have swelling in his hands and feet during the day. He does not take a diuretic. He does not have to strain or bear down to start his urinary stream.   02/07/2019: Nocturia improved with stopping jardiance and starting flomax   08/20/2019: Nocturia 0-1x off of flomax 0.42m   03/20/2020: Nocturia  rare. He limits fluid within 2 hours of bed     PMH: Past Medical History:  Diagnosis Date  . Arthritis    "shoulders, right ankle" (10/22/2018)  . Cataract   . Coronary artery disease    tandem 50/60% lesions in the LAD a 20% lesion the RCA catheterization 2004  . Degenerative disc disease   . Diet-controlled type 2 diabetes mellitus [place]    dx'd 10/20/2018  . Glucose intolerance (impaired glucose tolerance)   . History of kidney stones   . HLD (hyperlipidemia)    hx  . Hypothyroidism   . Impaired hearing   . Sebaceous cyst    neck  . Thyroid disease    not on meds  . Tinnitus, right ear   . Varicose veins     Surgical History: Past Surgical History:  Procedure Laterality Date  . CARDIAC CATHETERIZATION    . COLONOSCOPY  2011  . CYST EXCISION Right 09/07/2018   Procedure: EXCISION OF RECURRENT SEBACEOUS CYST OF NECK;  Surgeon: MJohnathan Hausen MD;  [facility]  Service: General;  Laterality: Right;  posterior  . FRACTURE SURGERY    . JOINT REPLACEMENT    . MULTIPLE TOOTH EXTRACTIONS  1970   all teeth removed   . REPAIR PERONEAL TENDONS ANKLE Right   .  REVISION TOTAL KNEE ARTHROPLASTY Right   . SHOULDER HARDWARE REMOVAL Left    "took screw out"  . SHOULDER SURGERY Bilateral    "right: fell,  put all ligaments back together, put pin in; left: MVI, dislocated, put it back together, healed, took screw out"  . TOTAL KNEE ARTHROPLASTY Bilateral     Home Medications:  Allergies as of 03/12/2021   No Known Allergies     Medication List       Accurate as of Mar 12, 2021  2:02 PM. If you have any questions, ask your nurse or doctor.        aspirin 81 MG chewable tablet Chew by mouth daily.   blood glucose meter kit and supplies Dispense based on patient and insurance preference. Use up to four times daily as directed. (FOR ICD-10 E10.9, E11.9).   fluticasone 50 MCG/ACT nasal spray Commonly known as: FLONASE SPRAY 1 SPRAY INTO EACH  NOSTRIL EVERY DAY   glipiZIDE 10 MG 24 hr tablet Commonly known as: GLUCOTROL XL Take 2 tablets by mouth daily.   insulin glargine 100 UNIT/ML injection Commonly known as: LANTUS Inject 0.2 mLs (20 Units total) into the skin daily.   [employer] Blood Glucose Test test strip Generic drug: glucose blood Accu Chek Guide strips (#200) and the Accu Chek Fast Clix  Lancets (#200) tests BID Diagnoses Code: E11.65   levothyroxine 50 MCG tablet Commonly known as: SYNTHROID Take 1 tablet (50 mcg total) by mouth daily at 6 (six) AM.   metFORMIN 500 MG tablet Commonly known as: Glucophage Take 1 tablet (500 mg total) by mouth 2 (two) times daily with a meal.   OneTouch Delica Plus [id number] Misc 1 EACH BY MISC.(NON-DRUG COMBO ROUTE) ROUTE IN THE MORNING AND AT BEDTIME.   rosuvastatin 40 MG tablet Commonly known as: CRESTOR Take 1 tablet by mouth daily.   sulfamethoxazole-trimethoprim 800-160 MG tablet Commonly known as: BACTRIM DS Take 1 tablet by mouth 2 (two) times daily.       Allergies: No Known Allergies  Family History: Family History  Problem Relation Age of Onset  . Heart attack Brother        triple bypass  . Diabetes Brother   . Heart block Brother   . Diabetes Brother   . Diabetes Mother   . Diabetes Father   . Diabetes Other        siblings  . Heart defect Other        both parents  . Colon cancer Neg Hx   . Colon polyps Neg Hx   . Esophageal cancer Neg Hx   . Rectal cancer Neg Hx   . Stomach cancer Neg Hx     Social History:  reports that he has never smoked. He has never used smokeless tobacco. He reports that he does not drink alcohol and does not use drugs.  ROS: All other review of systems were reviewed and are negative except what is noted above in HPI  Physical Exam: BP (!) 153/68   Pulse 90   Temp 98.2 F (36.8 C) (Oral)   Ht _0  (1.803 m)   BMI 33.05 kg/m   Constitutional:  Alert and oriented, No acute distress. HEENT: [place] AT, moist  mucus membranes.  Trachea midline, no masses. Cardiovascular: No clubbing, cyanosis, or edema. Respiratory: Normal respiratory effort, no increased work of breathing. GI: Abdomen is soft, nontender, nondistended, no abdominal masses GU: No CVA tenderness.  Lymph: No cervical or inguinal lymphadenopathy. Skin: No  rashes, bruises or suspicious lesions. Neurologic: Grossly intact, no focal deficits, moving all 4 extremities. Psychiatric: Normal mood and affect.  Laboratory Data: Lab Results  Component Value Date   WBC 17.5 (H) 10/23/2018   HGB 13.5 10/23/2018   HCT 41.0 10/23/2018   MCV 87.0 10/23/2018   PLT 216 10/23/2018    Lab Results  Component Value Date   CREATININE 0.89 10/23/2018    Lab Results  Component Value Date   PSA 1.71 09/23/2013   PSA 1.56 09/17/2012    No results found for: TESTOSTERONE  Lab Results  Component Value Date   HGBA1C 11.8 (H) 10/20/2018    Urinalysis    Component Value Date/Time   COLORURINE AMBER (A) 10/20/2018 2009   APPEARANCEUR CLOUDY (A) 10/20/2018 2009   LABSPEC 1.031 (H) 10/20/2018 2009   PHURINE 5.0 10/20/2018 2009   GLUCOSEU >=500 (A) 10/20/2018 2009   HGBUR LARGE (A) 10/20/2018 2009   BILIRUBINUR NEGATIVE 10/20/2018 2009   BILIRUBINUR neg 08/30/2014 0925   KETONESUR 20 (A) 10/20/2018 2009   PROTEINUR 100 (A) 10/20/2018 2009   UROBILINOGEN 1.0 10/20/2018 1847   NITRITE NEGATIVE 10/20/2018 2009   LEUKOCYTESUR LARGE (A) 10/20/2018 2009    Lab Results  Component Value Date   MUCUS trace 09/23/2013   BACTERIA RARE (A) 10/20/2018    Pertinent Imaging:  No results found for this or any previous visit.  No results found for this or any previous visit.  No results found for this or any previous visit.  No results found for this or any previous visit.  No results found for this or any previous visit.  No results found for this or any previous visit.  No results found for this or any previous visit.  No results  found for this or any previous visit.   Assessment & Plan:    1. Elevated prostate specific antigen (PSA) -RTC 1 year with PSA  2. Nocturia Fluid management   No follow-ups on file.  Nicolette Bang, MD  [facility] [place]

## 2021-03-18 ENCOUNTER — Ambulatory Visit: Payer: PPO | Admitting: Urology

## 2021-03-23 NOTE — Progress Notes (Signed)
Results sent via my chart 

## 2022-03-04 ENCOUNTER — Other Ambulatory Visit: Payer: PPO

## 2022-03-04 DIAGNOSIS — R972 Elevated prostate specific antigen [PSA]: Secondary | ICD-10-CM

## 2022-03-05 LAB — PSA: Prostate Specific Ag, Serum: 1.6 ng/mL (ref 0.0–4.0)

## 2022-03-11 ENCOUNTER — Ambulatory Visit (INDEPENDENT_AMBULATORY_CARE_PROVIDER_SITE_OTHER): Payer: PPO | Admitting: Urology

## 2022-03-11 VITALS — BP 151/90 | HR 81 | Ht 71.0 in | Wt 240.0 lb

## 2022-03-11 DIAGNOSIS — R972 Elevated prostate specific antigen [PSA]: Secondary | ICD-10-CM | POA: Diagnosis not present

## 2022-03-11 DIAGNOSIS — R351 Nocturia: Secondary | ICD-10-CM

## 2022-03-11 LAB — MICROSCOPIC EXAMINATION
Bacteria, UA: NONE SEEN
Renal Epithel, UA: NONE SEEN /hpf

## 2022-03-11 LAB — URINALYSIS, ROUTINE W REFLEX MICROSCOPIC
Bilirubin, UA: NEGATIVE
Glucose, UA: NEGATIVE
Ketones, UA: NEGATIVE
Nitrite, UA: NEGATIVE
Protein,UA: NEGATIVE
Specific Gravity, UA: 1.025 (ref 1.005–1.030)
Urobilinogen, Ur: 0.2 mg/dL (ref 0.2–1.0)
pH, UA: 5.5 (ref 5.0–7.5)

## 2022-03-11 NOTE — Progress Notes (Signed)
03/11/2022 9:29 AM   [name] 10-25-1950 [id number]  Referring provider: Aletha Halim., PA-C [address]  [place] [address]  Followup elevated PSA   HPI: [name] is a 71yo here for followup for elevated PSA. PSa decreased to 1.6 from 2.5. IPSS 2 QOL 0 on no therapy. No other complaints today.   PMH: Past Medical History:  Diagnosis Date   Arthritis    "shoulders, right ankle" (10/22/2018)   Cataract    Coronary artery disease    tandem 50/60% lesions in the LAD a 20% lesion the RCA catheterization 2004   Degenerative disc disease    Diet-controlled type 2 diabetes mellitus ([place])    dx'd 10/20/2018   Glucose intolerance (impaired glucose tolerance)    History of kidney stones    HLD (hyperlipidemia)    hx   Hypothyroidism    Impaired hearing    Sebaceous cyst    neck   Thyroid disease    not on meds   Tinnitus, right ear    Varicose veins     Surgical History: Past Surgical History:  Procedure Laterality Date   CARDIAC CATHETERIZATION     COLONOSCOPY  2011   CYST EXCISION Right 09/07/2018   Procedure: EXCISION OF RECURRENT SEBACEOUS CYST OF NECK;  Surgeon: Johnathan Hausen, MD;  Location: [facility];  Service: General;  Laterality: Right;  posterior   FRACTURE SURGERY     JOINT REPLACEMENT     MULTIPLE TOOTH EXTRACTIONS  1970   all teeth removed    REPAIR PERONEAL TENDONS ANKLE Right    REVISION TOTAL KNEE ARTHROPLASTY Right    SHOULDER HARDWARE REMOVAL Left    "took screw out"   SHOULDER SURGERY Bilateral    "right: fell,  put all ligaments back together, put pin in; left: MVI, dislocated, put it back together, healed, took screw out"   TOTAL KNEE ARTHROPLASTY Bilateral     Home Medications:  Allergies as of 03/11/2022   No Known Allergies      Medication List        Accurate as of Mar 11, 2022  9:29 AM. If you have any questions, ask your nurse or doctor.          aspirin 81 MG chewable tablet Chew  by mouth daily.   blood glucose meter kit and supplies Dispense based on patient and insurance preference. Use up to four times daily as directed. (FOR ICD-10 E10.9, E11.9).   fluticasone 50 MCG/ACT nasal spray Commonly known as: FLONASE SPRAY 1 SPRAY INTO EACH NOSTRIL EVERY DAY   glipiZIDE 10 MG 24 hr tablet Commonly known as: GLUCOTROL XL Take 2 tablets by mouth daily.   insulin glargine 100 UNIT/ML injection Commonly known as: LANTUS Inject 0.2 mLs (20 Units total) into the skin daily.   Jardiance 10 MG Tabs tablet Generic drug: empagliflozin Take 10 mg by mouth daily.   [employer] Blood Glucose Test test strip Generic drug: glucose blood Accu Chek Guide strips (#200) and the Accu Chek Fast Clix  Lancets (#200) tests BID Diagnoses Code: E11.65   levothyroxine 50 MCG tablet Commonly known as: SYNTHROID Take 1 tablet (50 mcg total) by mouth daily at 6 (six) AM.   metFORMIN 500 MG tablet Commonly known as: Glucophage Take 1 tablet (500 mg total) by mouth 2 (two) times daily with a meal.   OneTouch Delica Plus [id number] Misc 1 EACH BY MISC.(NON-DRUG COMBO ROUTE) ROUTE IN THE MORNING AND AT  BEDTIME.   rosuvastatin 40 MG tablet Commonly known as: CRESTOR Take 1 tablet by mouth daily.   sulfamethoxazole-trimethoprim 800-160 MG tablet Commonly known as: BACTRIM DS Take 1 tablet by mouth 2 (two) times daily.        Allergies: No Known Allergies  Family History: Family History  Problem Relation Age of Onset   Heart attack Brother        triple bypass   Diabetes Brother    Heart block Brother    Diabetes Brother    Diabetes Mother    Diabetes Father    Diabetes Other        siblings   Heart defect Other        both parents   Colon cancer Neg Hx    Colon polyps Neg Hx    Esophageal cancer Neg Hx    Rectal cancer Neg Hx    Stomach cancer Neg Hx     Social History:  reports that he has never smoked. He has never used smokeless tobacco. He reports that he does  not drink alcohol and does not use drugs.  ROS: All other review of systems were reviewed and are negative except what is noted above in HPI  Physical Exam: BP (!) 151/90   Pulse 81   Ht '5\' 11"'  (1.803 m)   Wt 240 lb (108.9 kg)   BMI 33.47 kg/m   Constitutional:  Alert and oriented, No acute distress. HEENT: [place] AT, moist mucus membranes.  Trachea midline, no masses. Cardiovascular: No clubbing, cyanosis, or edema. Respiratory: Normal respiratory effort, no increased work of breathing. GI: Abdomen is soft, nontender, nondistended, no abdominal masses GU: No CVA tenderness.  Lymph: No cervical or inguinal lymphadenopathy. Skin: No rashes, bruises or suspicious lesions. Neurologic: Grossly intact, no focal deficits, moving all 4 extremities. Psychiatric: Normal mood and affect.  Laboratory Data: Lab Results  Component Value Date   WBC 17.5 (H) 10/23/2018   HGB 13.5 10/23/2018   HCT 41.0 10/23/2018   MCV 87.0 10/23/2018   PLT 216 10/23/2018    Lab Results  Component Value Date   CREATININE 0.89 10/23/2018    Lab Results  Component Value Date   PSA 1.71 09/23/2013   PSA 1.56 09/17/2012    No results found for: TESTOSTERONE  Lab Results  Component Value Date   HGBA1C 11.8 (H) 10/20/2018    Urinalysis    Component Value Date/Time   COLORURINE AMBER (A) 10/20/2018 2009   APPEARANCEUR Clear 03/12/2021 1431   LABSPEC 1.031 (H) 10/20/2018 2009   PHURINE 5.0 10/20/2018 2009   GLUCOSEU 3+ (A) 03/12/2021 1431   HGBUR LARGE (A) 10/20/2018 2009   BILIRUBINUR Negative 03/12/2021 1431   KETONESUR 20 (A) 10/20/2018 2009   PROTEINUR Negative 03/12/2021 1431   PROTEINUR 100 (A) 10/20/2018 2009   UROBILINOGEN 1.0 10/20/2018 1847   NITRITE Negative 03/12/2021 1431   NITRITE NEGATIVE 10/20/2018 2009   LEUKOCYTESUR Negative 03/12/2021 1431    Lab Results  Component Value Date   LABMICR See below: 03/12/2021   WBCUA None seen 03/12/2021   LABEPIT 0-10 03/12/2021    MUCUS Present 03/12/2021   BACTERIA Few 03/12/2021    Pertinent Imaging:  No results found for this or any previous visit.  No results found for this or any previous visit.  No results found for this or any previous visit.  No results found for this or any previous visit.  No results found for this or any previous visit.  No results found for this or any previous visit.  No results found for this or any previous visit.  No results found for this or any previous visit.   Assessment & Plan:    1. Elevated prostate specific antigen (PSA) -RTC 2 years with PSA - Urinalysis, Routine w reflex microscopic  2. Nocturia -fluid management   No follow-ups on file.  Nicolette Bang, MD  [facility] [place]

## 2022-03-16 ENCOUNTER — Encounter: Payer: Self-pay | Admitting: Urology

## 2022-03-16 NOTE — Patient Instructions (Signed)

## 2022-09-28 LAB — TSH: TSH: 10.37 — AB (ref 0.41–5.90)

## 2022-12-26 LAB — TSH: TSH: 1.67 (ref 0.41–5.90)

## 2023-04-18 LAB — TSH: TSH: 5.61 (ref 0.41–5.90)

## 2023-07-27 LAB — TSH: TSH: 7.76 — AB (ref 0.41–5.90)

## 2023-11-08 NOTE — Patient Instructions (Signed)

## 2023-11-09 ENCOUNTER — Ambulatory Visit: Payer: PPO | Admitting: Nurse Practitioner

## 2023-11-09 ENCOUNTER — Telehealth: Payer: Self-pay | Admitting: Nurse Practitioner

## 2023-11-09 ENCOUNTER — Encounter: Payer: Self-pay | Admitting: Nurse Practitioner

## 2023-11-09 VITALS — BP 160/100 | HR 72 | Ht 71.0 in | Wt 241.0 lb

## 2023-11-09 DIAGNOSIS — E039 Hypothyroidism, unspecified: Secondary | ICD-10-CM | POA: Diagnosis not present

## 2023-11-09 NOTE — Progress Notes (Signed)
Endocrinology Consult Note                                         11/09/2023, 10:13 AM  Subjective:   Subjective    [name] is a 73 y.o.-year-old male patient being seen in consultation for hypothyroidism referred by Richmond Campbell., PA-C.  He and his wife were under the impression that he was to be seen here for his diabetes, but the referral was just for hypothyroidism.  Will reach out to PCP to see if we are to be seeing for DM as well in the future.   Past Medical History:  Diagnosis Date   Arthritis    "shoulders, right ankle" (10/22/2018)   Cataract    Coronary artery disease    tandem 50/60% lesions in the LAD a 20% lesion the RCA catheterization 2004   Degenerative disc disease    Diet-controlled type 2 diabetes mellitus (HCC)    dx'd 10/20/2018   Glucose intolerance (impaired glucose tolerance)    History of kidney stones    HLD (hyperlipidemia)    hx   Hypothyroidism    Impaired hearing    Sebaceous cyst    neck   Thyroid disease    not on meds   Tinnitus, right ear    Varicose veins     Past Surgical History:  Procedure Laterality Date   CARDIAC CATHETERIZATION     COLONOSCOPY  2011   CYST EXCISION Right 09/07/2018   Procedure: EXCISION OF RECURRENT SEBACEOUS CYST OF NECK;  Surgeon: Luretha Murphy, MD;  Location: [facility];  Service: General;  Laterality: Right;  posterior   FRACTURE SURGERY     JOINT REPLACEMENT     MULTIPLE TOOTH EXTRACTIONS  1970   all teeth removed    REPAIR PERONEAL TENDONS ANKLE Right    REVISION TOTAL KNEE ARTHROPLASTY Right    SHOULDER HARDWARE REMOVAL Left    "took screw out"   SHOULDER SURGERY Bilateral    "right: fell,  put all ligaments back together, put pin in; left: MVI, dislocated, put it back together, healed, took screw out"   TOTAL KNEE ARTHROPLASTY Bilateral     Social History   Socioeconomic History   Marital status:  Married    Spouse name: Not on file   Number of children: Not on file   Years of education: Not on file   Highest education level: Not on file  Occupational History   Not on file  Tobacco Use   Smoking status: Never   Smokeless tobacco: Never  Vaping Use   Vaping status: Never Used  Substance and Sexual Activity   Alcohol use: Never   Drug use: Never   Sexual activity: Not Currently  Other Topics Concern   Not on file  Social History Narrative   Not on file   Social Drivers of Health   Financial Resource Strain: Low Risk  (12/02/2021)   Received from [facility] visits prior  to 12/24/2022., [facility]   Overall Financial Resource Strain (CARDIA)    Difficulty of Paying Living Expenses: Not hard at all  Food Insecurity: Low Risk  (07/27/2023)   Received from [facility]   Hunger Vital Sign    Worried About Running Out of Food in the Last Year: Never true    Ran Out of Food in the Last Year: Never true  Transportation Needs: No Transportation Needs (07/27/2023)   Received from [facility]    In the past 12 months, has lack of reliable transportation kept you from medical appointments, meetings, work or from getting things needed for daily living? : No  Physical Activity: Unknown (12/02/2021)   Received [facility] visits prior to 12/24/2022., [facility]   Exercise Vital Sign    Days of Exercise per Week: 0 days    Minutes of Exercise per Session: Not on file  Stress: No Stress Concern Present (12/02/2021)   [facility] prior to 12/24/2022., [facility]   [employer] of Occupational Health - Occupational Stress Questionnaire    Feeling of Stress : Not at all  Social Connections: Socially Integrated (12/02/2021)   [facility] prior to [facility]24/2022., [facility]   Social Connection and Isolation Panel [NHANES]    Frequency  of Communication with Friends and Family: Once a week    Frequency of Social Gatherings with Friends and Family: Three times a week    Attends Religious Services: More than 4 times per year    Active Member of Clubs or Organizations: Yes    Attends Engineer, structural: More than 4 times per year    Marital Status: Married    Family History  Problem Relation Age of Onset   Heart attack Brother        triple bypass   Diabetes Brother    Heart block Brother    Diabetes Brother    Diabetes Mother    Diabetes Father    Diabetes Other        siblings   Heart defect Other        both parents   Colon cancer Neg Hx    Colon polyps Neg Hx    Esophageal cancer Neg Hx    Rectal cancer Neg Hx    Stomach cancer Neg Hx     Outpatient Encounter Medications as of 11/09/2023  Medication Sig   aspirin 81 MG chewable tablet Chew by mouth daily.   b complex vitamins capsule Take 1 capsule by mouth daily.   blood glucose meter kit and supplies Dispense based on patient and insurance preference. Use up to four times daily as directed. (FOR ICD-10 E10.9, E11.9).   fluticasone (FLONASE) 50 MCG/ACT nasal spray SPRAY 1 SPRAY INTO EACH NOSTRIL EVERY DAY   glipiZIDE (GLUCOTROL XL) 10 MG 24 hr tablet Take 2 tablets by mouth daily.   glucose blood ([employer] BLOOD GLUCOSE TEST) test strip Accu Chek Guide strips (#200) and the Accu Chek Fast Clix  Lancets (#200) tests BID Diagnoses Code: E11.65   Lancets (ONETOUCH DELICA PLUS LANCET33G) MISC 1 EACH BY MISC.(NON-DRUG COMBO ROUTE) ROUTE IN THE MORNING AND AT BEDTIME.   levothyroxine (SYNTHROID) 112 MCG tablet Take 112 mcg by mouth daily before breakfast.   levothyroxine (SYNTHROID) 125 MCG tablet Take 125 mcg by mouth daily before breakfast.   metFORMIN (GLUCOPHAGE) 500 MG tablet Take 1 tablet (  500 mg total) by mouth 2 (two) times daily with a meal.   OVER THE COUNTER MEDICATION [id number]- Patient states that he takes 2 a day   OVER THE COUNTER  MEDICATION Gymnema Sylvestre - Patient states that he takes 1 a day for his diabetes   OVER THE COUNTER MEDICATION Sugar Defender - Patient takes 3 drops a day for his blood sugar   OVER THE COUNTER MEDICATION Vanadyl Sulfate 20 mg - Patient states that he takes 1 a day   OVER THE COUNTER MEDICATION Innate One a Day without Iron - Patient states that he takes 1 a day   OVER THE COUNTER MEDICATION GlucoBio - Patient states that he takes 2 per day to help balance his blood sugars.   insulin glargine (LANTUS) 100 UNIT/ML injection Inject 0.2 mLs (20 Units total) into the skin daily. (Patient not taking: Reported on 11/09/2023)   JARDIANCE 10 MG TABS tablet Take 10 mg by mouth daily. (Patient not taking: Reported on 11/09/2023)   rosuvastatin (CRESTOR) 40 MG tablet Take 1 tablet by mouth daily. (Patient not taking: Reported on 11/09/2023)   sulfamethoxazole-trimethoprim (BACTRIM DS,SEPTRA DS) 800-160 MG tablet Take 1 tablet by mouth 2 (two) times daily. (Patient not taking: Reported on 11/09/2023)   [DISCONTINUED] levothyroxine (SYNTHROID, LEVOTHROID) 50 MCG tablet Take 1 tablet (50 mcg total) by mouth daily at 6 (six) AM. (Patient not taking: Reported on 11/09/2023)   No facility-administered encounter medications on file as of 11/09/2023.    ALLERGIES: Allergies  Allergen Reactions   Semaglutide Diarrhea   VACCINATION STATUS: Immunization History  Administered Date(s) Administered   Pneumococcal Polysaccharide-23 07/24/2002, 09/23/2013   Tdap 11/01/2006     HPI   [name]  is a patient with the above medical history. he was diagnosed with hypothyroidism at approximate age of 48 years, which required subsequent initiation of thyroid hormone replacement therapy. he was given various doses of Levothyroxine over the years, currently alternating between 2 doses per week 125 mcg on 5 days and 112 mcg on 2 days per week. he reports compliance to this medication:  Taking it daily on empty stomach  with water, separated by >30 minutes before breakfast and other medications, and by at least 4 hours from calcium, iron, PPIs, multivitamins .  I reviewed patient's thyroid tests:  Lab Results  Component Value Date   TSH 7.76 (A) 07/27/2023   TSH 5.61 04/18/2023   TSH 1.67 12/26/2022   TSH 10.37 (A) 09/28/2022   TSH 45.595 (H) 10/21/2018   TSH 2.373 09/23/2013   TSH 0.702 09/17/2012   FREET4 0.50 (L) 10/22/2018     He does note cold hands but no other overt symptoms of under-replacement.  Pt denies feeling nodules in neck, hoarseness, dysphagia/odynophagia, SOB with lying down.  he denies any known family history of thyroid disorders.  No family history of thyroid cancer.  No history of radiation therapy to head or neck.  No recent use of iodine supplements. He does take 2 different supplements containing Biotin (B-Complex vitamin and MVI).  I reviewed his chart and he also has a history of CAD, GERD, DM.   ROS:  Constitutional: no weight gain/loss, no fatigue, no subjective hyperthermia, + subjective hypothermia (especially in hands) Eyes: no blurry vision, no xerophthalmia ENT: no sore throat, no nodules palpated in throat, no dysphagia/odynophagia, no hoarseness Cardiovascular: no chest pain, no SOB, no palpitations, no leg swelling Respiratory: no cough, no SOB Gastrointestinal: + back pain (recently injured his  back while working in the yard-says it is improving somewhat) Musculoskeletal: no muscle/joint aches Skin: no rashes Neurological: no tremors, no numbness, no tingling, no dizziness Psychiatric: no depression, no anxiety   Objective:   Objective     BP (!) 160/100 (BP Location: Left Arm, Patient Position: Sitting, Cuff Size: Large) Comment: Retake manuel cuff. Patient states that he has never had issues with elevated HTN, feels that this is due to back injury. Zniyah Midkiff made aware.  Pulse 72   Ht 5\' 11"  (1.803 m)   Wt 241 lb (109.3 kg)   BMI 33.61 kg/m  Wt  Readings from Last 3 Encounters:  11/09/23 241 lb (109.3 kg)  03/11/22 240 lb (108.9 kg)  10/20/18 237 lb (107.5 kg)    BP Readings from Last 3 Encounters:  11/09/23 (!) 160/100  03/11/22 (!) 151/90  03/12/21 (!) 153/68     Constitutional:  Body mass index is 33.61 kg/m., not in acute distress, normal state of mind Eyes: PERRLA, EOMI, no exophthalmos ENT: moist mucous membranes, no thyromegaly, no cervical lymphadenopathy Cardiovascular: normal precordial activity, RRR, no murmur/rubs/gallops Respiratory:  adequate breathing efforts, no gross chest deformity, Clear to auscultation bilaterally Gastrointestinal: abdomen soft, non-tender, no distension, bowel sounds present Musculoskeletal: no gross deformities, strength intact in all four extremities Skin: moist, warm, no rashes Neurological: + slight tremor with outstretched hands, deep tendon reflexes normal in BLE.   CMP ( most recent) CMP     Component Value Date/Time   NA 133 (L) 10/23/2018 0602   K 3.6 10/23/2018 0602   CL 100 10/23/2018 0602   CO2 24 10/23/2018 0602   GLUCOSE 231 (H) 10/23/2018 0602   BUN 8 10/23/2018 0602   CREATININE 0.89 10/23/2018 0602   CREATININE 0.93 08/30/2014 0920   CALCIUM 8.4 (L) 10/23/2018 0602   PROT 7.1 10/20/2018 2002   ALBUMIN 3.8 10/20/2018 2002   AST 34 10/20/2018 2002   ALT 34 10/20/2018 2002   ALKPHOS 75 10/20/2018 2002   BILITOT 2.3 (H) 10/20/2018 2002   GFRNONAA >60 10/23/2018 0602     Diabetic Labs (most recent): Lab Results  Component Value Date   HGBA1C 11.8 (H) 10/20/2018   HGBA1C 8.1 08/30/2014   HGBA1C 6.5 09/23/2013     Lipid Panel ( most recent) Lipid Panel     Component Value Date/Time   CHOL 231 (H) 08/30/2014 0920   TRIG 106 08/30/2014 0920   HDL 47 08/30/2014 0920   CHOLHDL 4.9 08/30/2014 0920   VLDL 21 08/30/2014 0920   LDLCALC 163 (H) 08/30/2014 0920       Lab Results  Component Value Date   TSH 7.76 (A) 07/27/2023   TSH 5.61 04/18/2023    TSH 1.67 12/26/2022   TSH 10.37 (A) 09/28/2022   TSH 45.595 (H) 10/21/2018   TSH 2.373 09/23/2013   TSH 0.702 09/17/2012   FREET4 0.50 (L) 10/22/2018      Assessment & Plan:   ASSESSMENT / PLAN:  1. Hypothyroidism-unspecified   Patient with long-standing hypothyroidism, on levothyroxine therapy. On physical exam , patient  does not  have  gross goiter, thyroid nodules, or neck compression symptoms.  He is advised to continue his current regimen with Levothyroxine 125 mcg on 5 days and 112 mcg on 2 days per week.    - We discussed about correct intake of levothyroxine, at fasting, with water, separated by at least 30 minutes from breakfast, and separated by more than 4 hours from calcium, iron, multivitamins, acid  reflux medications (PPIs). -Patient is made aware of the fact that thyroid hormone replacement is needed for life, dose to be adjusted by periodic monitoring of thyroid function tests.  - Will check thyroid tests before next visit: TSH, free T4 ant thyroid antibody testing to help classify his dysfunction.  He is aware to stop all Biotin supplements for 5 days prior to getting thyroid labs drawn to prevent possibility of inaccurate results.  -Due to absence of clinical goiter, no need for thyroid ultrasound.  - Time spent with the patient: 45 minutes, of which >50% was spent in obtaining information about his symptoms, reviewing his previous labs, evaluations, and treatments, counseling him about his hypothyroidism, and developing a plan to confirm the diagnosis and long term treatment as necessary. Please refer to "Patient Self Inventory" in the Media tab for reviewed elements of pertinent patient history.  Criss Alvine participated in the discussions, expressed understanding, and voiced agreement with the above plans.  All questions were answered to his satisfaction. he is encouraged to contact clinic should he have any questions or concerns prior to his return  visit.   FOLLOW UP PLAN:  Return will call with thyroid lab results, for Thyroid follow up, Previsit labs.  Ronny Bacon, [facility] [address] [place], [place] [address] Phone: [phone] Fax: [phone]  11/09/2023, 10:13 AM

## 2023-11-09 NOTE — Telephone Encounter (Signed)
Pt was seen today for a new pt appt for hypothyroidism. Pt stated to Whitney he thought he was coming here for his diabetes. Whitney asked to find out from referring provider if she needs to see him for DM as well. I contacted his PCP, call not going through. I sent a fax to find out

## 2023-11-15 ENCOUNTER — Other Ambulatory Visit: Payer: Self-pay | Admitting: Nurse Practitioner

## 2023-11-15 ENCOUNTER — Encounter: Payer: Self-pay | Admitting: Nurse Practitioner

## 2023-11-15 DIAGNOSIS — E063 Autoimmune thyroiditis: Secondary | ICD-10-CM

## 2023-11-15 LAB — THYROGLOBULIN ANTIBODY: Thyroglobulin Antibody: 5.2 [IU]/mL — ABNORMAL HIGH (ref 0.0–0.9)

## 2023-11-15 LAB — TSH: TSH: 8.43 u[IU]/mL — ABNORMAL HIGH (ref 0.450–4.500)

## 2023-11-15 LAB — THYROID PEROXIDASE ANTIBODY: Thyroperoxidase Ab SerPl-aCnc: 105 [IU]/mL — ABNORMAL HIGH (ref 0–34)

## 2023-11-15 LAB — T4, FREE: Free T4: 1.33 ng/dL (ref 0.82–1.77)

## 2023-11-15 MED ORDER — LEVOTHYROXINE SODIUM 125 MCG PO TABS: 125.0000 ug | ORAL_TABLET | Freq: Every day | ORAL | 1 refills | Status: DC

## 2023-11-15 NOTE — Progress Notes (Signed)
Please schedule follow up in 3 months with labs.

## 2023-11-17 ENCOUNTER — Telehealth: Payer: Self-pay | Admitting: *Deleted

## 2023-11-17 NOTE — Telephone Encounter (Signed)
Patient was called and the results were reviewed with him per Whitney Reardon,NP. Patient is in agreement to take the Levothyroxine 125 mcg daily and he will go to the lab in 3 months for repeat labs.

## 2023-11-17 NOTE — Progress Notes (Signed)
Patient was called and his results were reviewed with him as well as Whitney's recommendation. He agrees to her treatment plan. A telephone encounter was sent to [facility] with this information.

## 2023-11-17 NOTE — Telephone Encounter (Signed)
-----   Message from [place] T sent at 11/17/2023  8:07 AM EST ----- Can you call pt? I need to be able to get him back on schedule. He has not read his mychart  Thanks ----- Message ----- From: Dani Gobble, NP Sent: 11/15/2023   9:48 AM EST To: Terisa Starr  Please schedule follow up in 3 months with labs.

## 2024-02-14 LAB — T4, FREE: Free T4: 1.53 ng/dL (ref 0.82–1.77)

## 2024-02-14 LAB — TSH: TSH: 2.73 u[IU]/mL (ref 0.450–4.500)

## 2024-02-20 ENCOUNTER — Ambulatory Visit: Payer: PPO | Admitting: Nurse Practitioner

## 2024-02-20 ENCOUNTER — Encounter: Payer: Self-pay | Admitting: Nurse Practitioner

## 2024-02-20 VITALS — BP 140/76 | HR 96 | Ht 71.0 in | Wt 237.6 lb

## 2024-02-20 DIAGNOSIS — E1121 Type 2 diabetes mellitus with diabetic nephropathy: Secondary | ICD-10-CM

## 2024-02-20 DIAGNOSIS — E063 Autoimmune thyroiditis: Secondary | ICD-10-CM

## 2024-02-20 DIAGNOSIS — Z7984 Long term (current) use of oral hypoglycemic drugs: Secondary | ICD-10-CM | POA: Diagnosis not present

## 2024-02-20 MED ORDER — ONETOUCH DELICA LANCETS 30G MISC: 6 refills | Status: DC

## 2024-02-20 MED ORDER — GLIPIZIDE ER 5 MG PO TB24: 5.0000 mg | ORAL_TABLET | Freq: Every day | ORAL | 3 refills | Status: DC

## 2024-02-20 MED ORDER — METFORMIN HCL 500 MG PO TABS: 1000.0000 mg | ORAL_TABLET | Freq: Two times a day (BID) | ORAL | 3 refills | Status: DC

## 2024-02-20 MED ORDER — ONETOUCH ULTRA TEST VI STRP: ORAL_STRIP | 12 refills | Status: DC

## 2024-02-20 NOTE — Patient Instructions (Signed)

## 2024-02-20 NOTE — Progress Notes (Unsigned)
 Endocrinology Follow Up Note                                         02/21/2024, 9:58 AM  Subjective:   Subjective    [name] is a 73 y.o.-year-old male patient being seen in follow up after being seen in consultation for hypothyroidism referred by Lory Rough., PA-C.  He and his wife were under the impression that he was to be seen here for his diabetes, but the referral was just for hypothyroidism.  PCP noted they would also like him to be seen for diabetes as well.   Past Medical History:  Diagnosis Date   Arthritis    "shoulders, right ankle" (10/22/2018)   Cataract    Coronary artery disease    tandem 50/60% lesions in the LAD a 20% lesion the RCA catheterization 2004   Degenerative disc disease    Diet-controlled type 2 diabetes mellitus (HCC)    dx'd 10/20/2018   Glucose intolerance (impaired glucose tolerance)    History of kidney stones    HLD (hyperlipidemia)    hx   Hypothyroidism    Impaired hearing    Sebaceous cyst    neck   Thyroid  disease    not on meds   Tinnitus, right ear    Varicose veins     Past Surgical History:  Procedure Laterality Date   CARDIAC CATHETERIZATION     COLONOSCOPY  2011   CYST EXCISION Right 09/07/2018   Procedure: EXCISION OF RECURRENT SEBACEOUS CYST OF NECK;  Surgeon: Jacolyn Matar, MD;  Location: [facility];  Service: General;  Laterality: Right;  posterior   FRACTURE SURGERY     JOINT REPLACEMENT     MULTIPLE TOOTH EXTRACTIONS  1970   all teeth removed    REPAIR PERONEAL TENDONS ANKLE Right    REVISION TOTAL KNEE ARTHROPLASTY Right    SHOULDER HARDWARE REMOVAL Left    "took screw out"   SHOULDER SURGERY Bilateral    "right: fell,  put all ligaments back together, put pin in; left: MVI, dislocated, put it back together, healed, took screw out"   TOTAL KNEE ARTHROPLASTY Bilateral     Social History   Socioeconomic History    Marital status: Married    Spouse name: Not on file   Number of children: Not on file   Years of education: Not on file   Highest education level: Not on file  Occupational History   Not on file  Tobacco Use   Smoking status: Never   Smokeless tobacco: Never  Vaping Use   Vaping status: Never Used  Substance and Sexual Activity   Alcohol use: Never   Drug use: Never   Sexual activity: Not Currently  Other Topics Concern   Not on file  Social History Narrative   Not on file   Social Drivers of Health   Financial Resource Strain: Low Risk  (12/02/2021)   Received from [facility] visits  prior to 12/24/2022., [facility]   Overall Financial Resource Strain (CARDIA)    Difficulty of Paying Living Expenses: Not hard at all  Food Insecurity: Low Risk  (01/19/2024)   Received from [facility]   Hunger Vital Sign    Worried About Running Out of Food in the Last Year: Never true    Ran Out of Food in the Last Year: Never true  Transportation Needs: No Transportation Needs (01/19/2024)   Received from [facility]    In the past 12 months, has lack of reliable transportation kept you from medical appointments, meetings, work or from getting things needed for daily living? : No  Physical Activity: Unknown (12/02/2021)   Received from [facility] visits prior to 12/24/2022., [facility]   Exercise Vital Sign    Days of Exercise per Week: 0 days    Minutes of Exercise per Session: Not on file  Stress: No Stress Concern Present (12/02/2021)   Received from [facility] visits prior to 12/24/2022., [facility]   [employer] of Occupational Health - Occupational Stress Questionnaire    Feeling of Stress : Not at all  Social Connections: Socially Integrated (12/02/2021)   [facility] prior to 12/24/2022., [facility]   Social Connection and Isolation Panel [NHANES]     Frequency of Communication with Friends and Family: Once a week    Frequency of Social Gatherings with Friends and Family: Three times a week    Attends Religious Services: More than 4 times per year    Active Member of Clubs or Organizations: Yes    Attends Engineer, structural: More than 4 times per year    Marital Status: Married    Family History  Problem Relation Age of Onset   Heart attack Brother        triple bypass   Diabetes Brother    Heart block Brother    Diabetes Brother    Diabetes Mother    Diabetes Father    Diabetes Other        siblings   Heart defect Other        both parents   Colon cancer Neg Hx    Colon polyps Neg Hx    Esophageal cancer Neg Hx    Rectal cancer Neg Hx    Stomach cancer Neg Hx     Outpatient Encounter Medications as of 02/20/2024  Medication Sig   glipiZIDE (GLUCOTROL XL) 5 MG 24 hr tablet Take 1 tablet (5 mg total) by mouth daily with breakfast.   glucose blood (ONETOUCH ULTRA TEST) test strip Use as instructed to monitor glucose twice daily   OneTouch Delica Lancets 30G MISC Use to check glucose twice daily   aspirin 81 MG chewable tablet Chew by mouth daily.   b complex vitamins capsule Take 1 capsule by mouth daily.   blood glucose meter kit and supplies Dispense based on patient and insurance preference. Use up to four times daily as directed. (FOR ICD-10 E10.9, E11.9).   fluticasone  (FLONASE ) 50 MCG/ACT nasal spray SPRAY 1 SPRAY INTO EACH NOSTRIL EVERY DAY   glucose blood ([employer] BLOOD GLUCOSE TEST) test strip Accu Chek Guide strips (#200) and the Accu Chek Fast Clix  Lancets (#200) tests BID Diagnoses Code: E11.65   levothyroxine  (SYNTHROID ) 125 MCG tablet Take 1 tablet (125 mcg total) by mouth daily before breakfast.   metFORMIN  (GLUCOPHAGE ) 500 MG tablet  Take 2 tablets (1,000 mg total) by mouth 2 (two) times daily with a meal.   OVER THE COUNTER MEDICATION ZO:XWRUEA- Patient states that he takes 2 a day   OVER THE  COUNTER MEDICATION Gymnema Sylvestre - Patient states that he takes 1 a day for his diabetes   OVER THE COUNTER MEDICATION Sugar Defender - Patient takes 3 drops a day for his blood sugar   OVER THE COUNTER MEDICATION Vanadyl Sulfate 20 mg - Patient states that he takes 1 a day   OVER THE COUNTER MEDICATION Innate One a Day without Iron - Patient states that he takes 1 a day   OVER THE COUNTER MEDICATION GlucoBio - Patient states that he takes 2 per day to help balance his blood sugars.   rosuvastatin (CRESTOR) 40 MG tablet Take 1 tablet by mouth daily. (Patient not taking: Reported on 11/09/2023)   sulfamethoxazole -trimethoprim  (BACTRIM  DS,SEPTRA  DS) 800-160 MG tablet Take 1 tablet by mouth 2 (two) times daily. (Patient not taking: Reported on 11/09/2023)   [DISCONTINUED] glipiZIDE (GLUCOTROL XL) 10 MG 24 hr tablet Take 2 tablets by mouth daily.   [DISCONTINUED] insulin  glargine (LANTUS ) 100 UNIT/ML injection Inject 0.2 mLs (20 Units total) into the skin daily. (Patient not taking: Reported on 11/09/2023)   [DISCONTINUED] JARDIANCE 10 MG TABS tablet Take 10 mg by mouth daily. (Patient not taking: Reported on 11/09/2023)   [DISCONTINUED] Lancets (ONETOUCH DELICA PLUS LANCET33G) MISC 1 EACH BY MISC.(NON-DRUG COMBO ROUTE) ROUTE IN THE MORNING AND AT BEDTIME.   [DISCONTINUED] metFORMIN  (GLUCOPHAGE ) 500 MG tablet Take 1 tablet (500 mg total) by mouth 2 (two) times daily with a meal.   No facility-administered encounter medications on file as of 02/20/2024.    ALLERGIES: Allergies  Allergen Reactions   Semaglutide Diarrhea   VACCINATION STATUS: Immunization History  Administered Date(s) Administered   Pneumococcal Polysaccharide-23 07/24/2002, 09/23/2013   Tdap 11/01/2006     HPI  Diabetes He presents for his initial diabetic visit. He has type 2 diabetes mellitus. His disease course has been fluctuating. Hypoglycemia symptoms include dizziness, hunger, nervousness/anxiousness, sweats and  tremors. There are no diabetic associated symptoms. There are no hypoglycemic complications. Diabetic complications include heart disease (hx CAD) and nephropathy. Risk factors for coronary artery disease include diabetes mellitus, dyslipidemia, male sex, obesity, hypertension and family history. Current diabetic treatment includes oral agent (dual therapy). He is compliant with treatment most of the time. His weight is fluctuating minimally. He is following a generally unhealthy diet. When asked about meal planning, he reported none. He has not had a previous visit with a dietitian. He participates in exercise intermittently. His home blood glucose trend is fluctuating dramatically. (He presents today, accompanied by his wife, with no meter or logs to review.  His most recent A1c done on 3/28 was 8.6%, improving slightly from last A1c reading of 9%.  He is currently taking Glipizide 20 mg XL daily and Metformin  1000 mg po twice daily.  He does experience symptoms of hypoglycemia at times for which he eats a snack to correct.  He admits he does not monitor glucose as often as he should.) An ACE inhibitor/angiotensin II receptor blocker is not being taken. He does not see a podiatrist.Eye exam is current.    Canary Ceo  is a patient with the above medical history. he was diagnosed with hypothyroidism at approximate age of 48 years, which required subsequent initiation of thyroid  hormone replacement therapy. he was given various doses of Levothyroxine  over the  years, currently alternating between 2 doses per week 125 mcg on 5 days and 112 mcg on 2 days per week. he reports compliance to this medication:  Taking it daily on empty stomach with water, separated by >30 minutes before breakfast and other medications, and by at least 4 hours from calcium, iron, PPIs, multivitamins .  I reviewed patient's thyroid  tests:  Lab Results  Component Value Date   TSH 2.730 02/13/2024   TSH 8.430 (H) 11/14/2023   TSH  7.76 (A) 07/27/2023   TSH 5.61 04/18/2023   TSH 1.67 12/26/2022   TSH 10.37 (A) 09/28/2022   TSH 45.595 (H) 10/21/2018   TSH 2.373 09/23/2013   TSH 0.702 09/17/2012   FREET4 1.53 02/13/2024   FREET4 1.33 11/14/2023   FREET4 0.50 (L) 10/22/2018     He does note cold hands but no other overt symptoms of under-replacement.  Pt denies feeling nodules in neck, hoarseness, dysphagia/odynophagia, SOB with lying down.  he denies any known family history of thyroid  disorders.  No family history of thyroid  cancer.  No history of radiation therapy to head or neck.  No recent use of iodine supplements. He does take 2 different supplements containing Biotin (B-Complex vitamin and MVI).  I reviewed his chart and he also has a history of CAD, GERD, DM.   ROS:  Constitutional: no weight gain/loss, no fatigue, no subjective hyperthermia, + subjective hypothermia (especially in hands) Eyes: no blurry vision, no xerophthalmia ENT: no sore throat, no nodules palpated in throat, no dysphagia/odynophagia, no hoarseness Cardiovascular: no chest pain, no SOB, no palpitations, no leg swelling Respiratory: no cough, no SOB Gastrointestinal: + back pain (recently injured his back while working in the yard-says it is improving somewhat) Musculoskeletal: no muscle/joint aches Skin: no rashes Neurological: no tremors, no numbness, no tingling, no dizziness Psychiatric: no depression, no anxiety   Objective:   Objective     BP (!) 140/76   Pulse 96   Ht 5\' 11"  (1.803 m)   Wt 237 lb 9.6 oz (107.8 kg)   BMI 33.14 kg/m  Wt Readings from Last 3 Encounters:  02/20/24 237 lb 9.6 oz (107.8 kg)  11/09/23 241 lb (109.3 kg)  03/11/22 240 lb (108.9 kg)    BP Readings from Last 3 Encounters:  02/20/24 (!) 140/76  11/09/23 (!) 160/100  03/11/22 (!) 151/90      Physical Exam- Limited  Constitutional:  Body mass index is 33.14 kg/m. , not in acute distress, normal state of mind Eyes:  EOMI, no  exophthalmos Musculoskeletal: no gross deformities, strength intact in all four extremities, no gross restriction of joint movements Skin:  no rashes, no hyperemia Neurological: no tremor with outstretched hands   CMP ( most recent) CMP     Component Value Date/Time   NA 133 (L) 10/23/2018 0602   K 3.6 10/23/2018 0602   CL 100 10/23/2018 0602   CO2 24 10/23/2018 0602   GLUCOSE 231 (H) 10/23/2018 0602   BUN 8 10/23/2018 0602   CREATININE 0.89 10/23/2018 0602   CREATININE 0.93 08/30/2014 0920   CALCIUM 8.4 (L) 10/23/2018 0602   PROT 7.1 10/20/2018 2002   ALBUMIN 3.8 10/20/2018 2002   AST 34 10/20/2018 2002   ALT 34 10/20/2018 2002   ALKPHOS 75 10/20/2018 2002   BILITOT 2.3 (H) 10/20/2018 2002   GFRNONAA >60 10/23/2018 0602     Diabetic Labs (most recent): Lab Results  Component Value Date   HGBA1C 11.8 (H) 10/20/2018  HGBA1C 8.1 08/30/2014   HGBA1C 6.5 09/23/2013     Lipid Panel ( most recent) Lipid Panel     Component Value Date/Time   CHOL 231 (H) 08/30/2014 0920   TRIG 106 08/30/2014 0920   HDL 47 08/30/2014 0920   CHOLHDL 4.9 08/30/2014 0920   VLDL 21 08/30/2014 0920   LDLCALC 163 (H) 08/30/2014 0920       Lab Results  Component Value Date   TSH 2.730 02/13/2024   TSH 8.430 (H) 11/14/2023   TSH 7.76 (A) 07/27/2023   TSH 5.61 04/18/2023   TSH 1.67 12/26/2022   TSH 10.37 (A) 09/28/2022   TSH 45.595 (H) 10/21/2018   TSH 2.373 09/23/2013   TSH 0.702 09/17/2012   FREET4 1.53 02/13/2024   FREET4 1.33 11/14/2023   FREET4 0.50 (L) 10/22/2018     Latest Reference Range & Units 09/28/22 00:00 12/26/22 00:00 04/18/23 00:00 07/27/23 00:00 11/14/23 11:28 02/13/24 13:06    Latest Reference Range & Units 09/28/22 00:00 12/26/22 00:00 04/18/23 00:00 07/27/23 00:00 11/14/23 11:28 02/13/24 13:06  TSH 0.450 - 4.500 uIU/mL 10.37 ! (E) 1.67 (E) 5.61 (E) 7.76 ! (E) 8.430 (H) 2.730  T4,Free(Direct) 0.82 - 1.77 ng/dL     9.60 4.54  Thyroperoxidase Ab SerPl-aCnc 0 -  34 IU/mL     105 (H)   Thyroglobulin Antibody 0.0 - 0.9 IU/mL     5.2 (H)   !: Data is abnormal (H): Data is abnormally high (E): External lab result  Assessment & Plan:   ASSESSMENT / PLAN:  1. 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  His previsit TFTs are consistent with appropriate hormone replacement.  He is advised to continue his current regimen with Levothyroxine  125 mcg daily before breakfast.    - We discussed about correct intake of levothyroxine , at fasting, with water, separated by at least 30 minutes from breakfast, and separated by more than 4 hours from calcium, iron, multivitamins, acid reflux medications (PPIs). -Patient is made aware of the fact that thyroid  hormone replacement is needed for life, dose to be adjusted by periodic monitoring of thyroid  function tests.  2. Type 2 diabetes with nephropathy  He presents today, accompanied by his wife, with no meter or logs to review.  His most recent A1c done on 3/28 was 8.6%, improving slightly from last A1c reading of 9%.  He is currently taking Glipizide 20 mg XL daily and Metformin  1000 mg po twice daily.  He does experience symptoms of hypoglycemia at times for which he eats a snack to correct.  He admits he does not monitor glucose as often as he should.  -Recent labs reviewed.  The following Lifestyle Medicine recommendations according to American College of Lifestyle Medicine [facility]) were discussed and offered to patient and he agrees to start the journey:  A. Whole Foods, Plant-based plate comprising of fruits and vegetables, plant-based proteins, whole-grain carbohydrates was discussed in detail with the patient.   A list for source of those nutrients were also provided to the patient.  Patient will use only water or unsweetened tea for hydration. B.  The need to stay away from risky substances including alcohol,  smoking; obtaining 7 to 9 hours of restorative sleep, at least 150 minutes of moderate intensity exercise weekly, the importance of healthy social connections,  and stress reduction techniques were discussed. C.  A full color page of  Calorie density of various food groups per pound showing examples  of each food groups was provided to the patient.   - Nutritional counseling repeated at each appointment due to patients tendency to fall back in to old habits.  - The patient admits there is a room for improvement in their diet and drink choices. -  Suggestion is made for the patient to avoid simple carbohydrates from their diet including Cakes, Sweet Desserts / Pastries, Ice Cream, Soda (diet and regular), Sweet Tea, Candies, Chips, Cookies, Sweet Pastries, Store Bought Juices, Alcohol in Excess of 1-2 drinks a day, Artificial Sweeteners, Coffee Creamer, and "Sugar-free" Products. This will help patient to have stable blood glucose profile and potentially avoid unintended weight gain.   - I encouraged the patient to switch to unprocessed or minimally processed complex starch and increased protein intake (animal or plant source), fruits, and vegetables.   - Patient is advised to stick to a routine mealtimes to eat 3 meals a day and avoid unnecessary snacks (to snack only to correct hypoglycemia).  I advised him to lower his does of Glipizide to 5 mg XL daily for safety purposes.  He can continue his Metformin  1000 mg po twice daily after meals.  He is advised to check glucose at least once daily, before breakfast and to reach out if glucose is less than 70 or above 300 for 3 tests in a row.      I spent  43  minutes in the care of the patient today including review of labs from CMP, Lipids, Thyroid  Function, Hematology (current and previous including abstractions from other facilities); face-to-face time discussing  his blood glucose readings/logs, discussing hypoglycemia and hyperglycemia episodes  and symptoms, medications doses, his options of short and long term treatment based on the latest standards of care / guidelines;  discussion about incorporating lifestyle medicine;  and documenting the encounter. Risk reduction counseling performed per USPSTF guidelines to reduce obesity and cardiovascular risk factors.     Please refer to Patient Instructions for Blood Glucose Monitoring and Insulin /Medications Dosing Guide"  in media tab for additional information. Please  also refer to " Patient Self Inventory" in the Media  tab for reviewed elements of pertinent patient history.  Canary Ceo participated in the discussions, expressed understanding, and voiced agreement with the above plans.  All questions were answered to his satisfaction. he is encouraged to contact clinic should he have any questions or concerns prior to his return visit.   FOLLOW UP PLAN:  Return in about 4 months (around 06/21/2024) for Diabetes F/U with A1c in office, No previsit labs, Bring meter and logs.  Hulon Magic, [facility] [address] [place], [place] [address] Phone: [phone] Fax: [phone]  02/21/2024, 9:58 AM

## 2024-03-04 ENCOUNTER — Encounter: Payer: Self-pay | Admitting: Urology

## 2024-03-04 ENCOUNTER — Ambulatory Visit: Payer: PPO | Admitting: Urology

## 2024-03-04 VITALS — BP 152/77 | HR 74

## 2024-03-04 DIAGNOSIS — R351 Nocturia: Secondary | ICD-10-CM | POA: Diagnosis not present

## 2024-03-04 DIAGNOSIS — R972 Elevated prostate specific antigen [PSA]: Secondary | ICD-10-CM

## 2024-03-04 LAB — URINALYSIS, ROUTINE W REFLEX MICROSCOPIC
Bilirubin, UA: NEGATIVE
Glucose, UA: NEGATIVE
Ketones, UA: NEGATIVE
Nitrite, UA: NEGATIVE
Protein,UA: NEGATIVE
RBC, UA: NEGATIVE
Specific Gravity, UA: 1.02 (ref 1.005–1.030)
Urobilinogen, Ur: 0.2 mg/dL (ref 0.2–1.0)
pH, UA: 6 (ref 5.0–7.5)

## 2024-03-04 LAB — MICROSCOPIC EXAMINATION
Bacteria, UA: NONE SEEN
RBC, Urine: NONE SEEN /HPF (ref 0–2)

## 2024-03-04 NOTE — Progress Notes (Signed)
 03/04/2024 8:26 AM   [name] 1950-12-02 [id number]  Referring provider: Lory Rough., PA-C [address]  [place] [address]  Followup elevated PSA   HPI: [name] is a 72yo here for followup for elevated PSA and nocturia. PSA stable at 1.74. IPSS 4 QOL 0 on no BPH therapy. Nocturia 0-1x depending on fluid consumption. He has occasional post void dribbling.    PMH: Past Medical History:  Diagnosis Date   Arthritis    "shoulders, right ankle" (10/22/2018)   Cataract    Coronary artery disease    tandem 50/60% lesions in the LAD a 20% lesion the RCA catheterization 2004   Degenerative disc disease    Diet-controlled type 2 diabetes mellitus (HCC)    dx'd 10/20/2018   Glucose intolerance (impaired glucose tolerance)    History of kidney stones    HLD (hyperlipidemia)    hx   Hypothyroidism    Impaired hearing    Sebaceous cyst    neck   Thyroid  disease    not on meds   Tinnitus, right ear    Varicose veins     Surgical History: Past Surgical History:  Procedure Laterality Date   CARDIAC CATHETERIZATION     COLONOSCOPY  2011   CYST EXCISION Right 09/07/2018   Procedure: EXCISION OF RECURRENT SEBACEOUS CYST OF NECK;  Surgeon: Jacolyn Matar, MD;  Location: [facility];  Service: General;  Laterality: Right;  posterior   FRACTURE SURGERY     JOINT REPLACEMENT     MULTIPLE TOOTH EXTRACTIONS  1970   all teeth removed    REPAIR PERONEAL TENDONS ANKLE Right    REVISION TOTAL KNEE ARTHROPLASTY Right    SHOULDER HARDWARE REMOVAL Left    "took screw out"   SHOULDER SURGERY Bilateral    "right: fell,  put all ligaments back together, put pin in; left: MVI, dislocated, put it back together, healed, took screw out"   TOTAL KNEE ARTHROPLASTY Bilateral     Home Medications:  Allergies as of 03/04/2024       Reactions   Semaglutide Diarrhea        Medication List        Accurate as of Mar 04, 2024  8:26 AM. If you have any  questions, ask your nurse or doctor.          STOP taking these medications    OVER THE COUNTER MEDICATION   OVER THE COUNTER MEDICATION   OVER THE COUNTER MEDICATION   OVER THE COUNTER MEDICATION   rosuvastatin 40 MG tablet Commonly known as: CRESTOR   sulfamethoxazole -trimethoprim  800-160 MG tablet Commonly known as: BACTRIM  DS       TAKE these medications    aspirin 81 MG chewable tablet Chew by mouth daily.   b complex vitamins capsule Take 1 capsule by mouth daily.   blood glucose meter kit and supplies Dispense based on patient and insurance preference. Use up to four times daily as directed. (FOR ICD-10 E10.9, E11.9).   fluticasone  50 MCG/ACT nasal spray Commonly known as: FLONASE  SPRAY 1 SPRAY INTO EACH NOSTRIL EVERY DAY   glipiZIDE  5 MG 24 hr tablet Commonly known as: GLUCOTROL  XL Take 1 tablet (5 mg total) by mouth daily with breakfast.   [employer] Blood Glucose Test test strip Generic drug: glucose blood Accu Chek Guide strips (#200) and the Accu Chek Fast Clix  Lancets (#200) tests BID Diagnoses Code: E11.65   OneTouch Ultra Test test strip  Generic drug: glucose blood Use as instructed to monitor glucose twice daily   levothyroxine  125 MCG tablet Commonly known as: SYNTHROID  Take 1 tablet (125 mcg total) by mouth daily before breakfast.   metFORMIN  500 MG tablet Commonly known as: Glucophage  Take 2 tablets (1,000 mg total) by mouth 2 (two) times daily with a meal.   OneTouch Delica Lancets 30G Misc Use to check glucose twice daily   OVER THE COUNTER MEDICATION Gymnema Sylvestre - Patient states that he takes 1 a day for his diabetes   OVER THE COUNTER MEDICATION Sugar Defender - Patient takes 3 drops a day for his blood sugar        Allergies:  Allergies  Allergen Reactions   Semaglutide Diarrhea    Family History: Family History  Problem Relation Age of Onset   Heart attack Brother        triple bypass   Diabetes Brother     Heart block Brother    Diabetes Brother    Diabetes Mother    Diabetes Father    Diabetes Other        siblings   Heart defect Other        both parents   Colon cancer Neg Hx    Colon polyps Neg Hx    Esophageal cancer Neg Hx    Rectal cancer Neg Hx    Stomach cancer Neg Hx     Social History:  reports that he has never smoked. He has never used smokeless tobacco. He reports that he does not drink alcohol and does not use drugs.  ROS: All other review of systems were reviewed and are negative except what is noted above in HPI  Physical Exam: BP (!) 152/77   Pulse 74   Constitutional:  Alert and oriented, No acute distress. HEENT: [place] AT, moist mucus membranes.  Trachea midline, no masses. Cardiovascular: No clubbing, cyanosis, or edema. Respiratory: Normal respiratory effort, no increased work of breathing. GI: Abdomen is soft, nontender, nondistended, no abdominal masses GU: No CVA tenderness.  Lymph: No cervical or inguinal lymphadenopathy. Skin: No rashes, bruises or suspicious lesions. Neurologic: Grossly intact, no focal deficits, moving all 4 extremities. Psychiatric: Normal mood and affect.  Laboratory Data: Lab Results  Component Value Date   WBC 17.5 (H) 10/23/2018   HGB 13.5 10/23/2018   HCT 41.0 10/23/2018   MCV 87.0 10/23/2018   PLT 216 10/23/2018    Lab Results  Component Value Date   CREATININE 0.89 10/23/2018    Lab Results  Component Value Date   PSA 1.71 09/23/2013   PSA 1.56 09/17/2012    No results found for: "TESTOSTERONE"  Lab Results  Component Value Date   HGBA1C 11.8 (H) 10/20/2018    Urinalysis    Component Value Date/Time   COLORURINE AMBER (A) 10/20/2018 2009   APPEARANCEUR Clear 03/11/2022 0914   LABSPEC 1.031 (H) 10/20/2018 2009   PHURINE 5.0 10/20/2018 2009   GLUCOSEU Negative 03/11/2022 0914   HGBUR LARGE (A) 10/20/2018 2009   BILIRUBINUR Negative 03/11/2022 0914   KETONESUR 20 (A) 10/20/2018 2009   PROTEINUR  Negative 03/11/2022 0914   PROTEINUR 100 (A) 10/20/2018 2009   UROBILINOGEN 1.0 10/20/2018 1847   NITRITE Negative 03/11/2022 0914   NITRITE NEGATIVE 10/20/2018 2009   LEUKOCYTESUR 1+ (A) 03/11/2022 0914    Lab Results  Component Value Date   LABMICR See below: 03/11/2022   WBCUA 11-30 (A) 03/11/2022   LABEPIT 0-10 03/11/2022   MUCUS Present  03/12/2021   BACTERIA None seen 03/11/2022    Pertinent Imaging:  No results found for this or any previous visit.  No results found for this or any previous visit.  No results found for this or any previous visit.  No results found for this or any previous visit.  No results found for this or any previous visit.  No results found for this or any previous visit.  No results found for this or any previous visit.  No results found for this or any previous visit.   Assessment & Plan:    1. Elevated prostate specific antigen (PSA) (Primary) The patient is to continue PSA surveillance with his PCP - Urinalysis, Routine w reflex microscopic  2. Nocturia Decrease fluid intake within 2 hours of going to bed   No follow-ups on file.  Johnie Nailer, MD  [facility] [place]

## 2024-03-04 NOTE — Patient Instructions (Signed)

## 2024-06-21 ENCOUNTER — Ambulatory Visit: Admitting: Nurse Practitioner

## 2024-06-26 ENCOUNTER — Encounter: Payer: Self-pay | Admitting: Nurse Practitioner

## 2024-06-26 ENCOUNTER — Ambulatory Visit: Admitting: Nurse Practitioner

## 2024-06-26 VITALS — BP 122/64 | HR 69 | Ht 71.0 in | Wt 235.4 lb

## 2024-06-26 DIAGNOSIS — E1121 Type 2 diabetes mellitus with diabetic nephropathy: Secondary | ICD-10-CM

## 2024-06-26 DIAGNOSIS — E063 Autoimmune thyroiditis: Secondary | ICD-10-CM | POA: Diagnosis not present

## 2024-06-26 DIAGNOSIS — Z7984 Long term (current) use of oral hypoglycemic drugs: Secondary | ICD-10-CM | POA: Diagnosis not present

## 2024-06-26 LAB — POCT GLYCOSYLATED HEMOGLOBIN (HGB A1C): Hemoglobin A1C: 9.5 % — AB (ref 4.0–5.6)

## 2024-06-26 MED ORDER — LEVOTHYROXINE SODIUM 125 MCG PO TABS: 125.0000 ug | ORAL_TABLET | Freq: Every day | ORAL | 1 refills | Status: DC

## 2024-06-26 NOTE — Progress Notes (Signed)
 Endocrinology Follow Up Note                                         06/26/2024, 8:35 AM  Subjective:   Subjective    [name] is a 73 y.o.-year-old male patient being seen in follow up after being seen in consultation for hypothyroidism referred by Debrah Josette ORN., PA-C.  He and his wife were under the impression that he was to be seen here for his diabetes, but the referral was just for hypothyroidism.  PCP noted they would also like him to be seen for diabetes as well.   Past Medical History:  Diagnosis Date   Arthritis    shoulders, right ankle (10/22/2018)   Cataract    Coronary artery disease    tandem 50/60% lesions in the LAD a 20% lesion the RCA catheterization 2004   Degenerative disc disease    Diet-controlled type 2 diabetes mellitus (HCC)    dx'd 10/20/2018   Glucose intolerance (impaired glucose tolerance)    History of kidney stones    HLD (hyperlipidemia)    hx   Hypothyroidism    Impaired hearing    Sebaceous cyst    neck   Thyroid  disease    not on meds   Tinnitus, right ear    Varicose veins     Past Surgical History:  Procedure Laterality Date   CARDIAC CATHETERIZATION     COLONOSCOPY  2011   CYST EXCISION Right 09/07/2018   Procedure: EXCISION OF RECURRENT SEBACEOUS CYST OF NECK;  Surgeon: Gladis Cough, MD;  Location:  [facility];  Service: General;  Laterality: Right;  posterior   FRACTURE SURGERY     JOINT REPLACEMENT     MULTIPLE TOOTH EXTRACTIONS  1970   all teeth removed    REPAIR PERONEAL TENDONS ANKLE Right    REVISION TOTAL KNEE ARTHROPLASTY Right    SHOULDER HARDWARE REMOVAL Left    took screw out   SHOULDER SURGERY Bilateral    right: fell,  put all ligaments back together, put pin in; left: MVI, dislocated, put it back together, healed, took screw out   TOTAL KNEE ARTHROPLASTY Bilateral     Social History   Socioeconomic History    Marital status: Married    Spouse name: Not on file   Number of children: Not on file   Years of education: Not on file   Highest education level: Not on file  Occupational History   Not on file  Tobacco Use   Smoking status: Never   Smokeless tobacco: Never  Vaping Use   Vaping status: Never Used  Substance and Sexual Activity   Alcohol use: Never   Drug use: Never   Sexual activity: Not Currently  Other Topics Concern   Not on file  Social History Narrative   Not on file   Social Drivers of Health   Financial Resource Strain: Low Risk  (12/02/2021)   Received from [facility] visits  prior to 12/24/2022., [facility]   Overall Financial Resource Strain (CARDIA)    Difficulty of Paying Living Expenses: Not hard at all  Food Insecurity: Low Risk  (01/19/2024)   Received from [facility]   Hunger Vital Sign    Within the past 12 months, you worried that your food would run out before you got money to buy more: Never true    Within the past 12 months, the food you bought just didn't last and you didn't have money to get more. : Never true  Transportation Needs: No Transportation Needs (01/19/2024)   Received from [facility]    In the past 12 months, has lack of reliable transportation kept you from medical appointments, meetings, work or from getting things needed for daily living? : No  Physical Activity: Unknown (12/02/2021)   [facility] visits prior to 12/24/2022., [facility]   Exercise Vital Sign    On average, how many days per week do you engage in moderate to strenuous exercise (like a brisk walk)?: 0 days    Minutes of Exercise per Session: Not on file  Stress: No Stress Concern Present (12/02/2021)   [facility] prior to 12/24/2022., [facility]   [employer] of Occupational Health - Occupational Stress Questionnaire    Feeling of Stress : Not at  all  Social Connections: Socially Integrated (12/02/2021)   Received from [facility] visits prior to 12/24/2022., [facility]   Social Connection and Isolation Panel    In a typical week, how many times do you talk on the phone with family, friends, or neighbors?: Once a week    How often do you get together with friends or relatives?: Three times a week    How often do you attend church or religious services?: More than 4 times per year    Do you belong to any clubs or organizations such as church groups, unions, fraternal or athletic groups, or school groups?: Yes    How often do you attend meetings of the clubs or organizations you belong to?: More than 4 times per year    Are you married, widowed, divorced, separated, never married, or living with a partner?: Married    Family History  Problem Relation Age of Onset   Heart attack Brother        triple bypass   Diabetes Brother    Heart block Brother    Diabetes Brother    Diabetes Mother    Diabetes Father    Diabetes Other        siblings   Heart defect Other        both parents   Colon cancer Neg Hx    Colon polyps Neg Hx    Esophageal cancer Neg Hx    Rectal cancer Neg Hx    Stomach cancer Neg Hx     Outpatient Encounter Medications as of 06/26/2024  Medication Sig   aspirin 81 MG chewable tablet Chew by mouth daily.   b complex vitamins capsule Take 1 capsule by mouth daily.   blood glucose meter kit and supplies Dispense based on patient and insurance preference. Use up to four times daily as directed. (FOR ICD-10 E10.9, E11.9).   fluticasone  (FLONASE ) 50 MCG/ACT nasal spray SPRAY 1 SPRAY INTO EACH NOSTRIL EVERY DAY   glipiZIDE  (GLUCOTROL  XL) 5 MG 24 hr tablet Take 1 tablet (5 mg total)  by mouth daily with breakfast.   glucose blood ([employer] BLOOD GLUCOSE TEST) test strip Accu Chek Guide strips (#200) and the Accu Chek Fast Clix  Lancets (#200) tests BID Diagnoses Code: E11.65   glucose blood  (ONETOUCH ULTRA TEST) test strip Use as instructed to monitor glucose twice daily   metFORMIN  (GLUCOPHAGE ) 500 MG tablet Take 2 tablets (1,000 mg total) by mouth 2 (two) times daily with a meal.   OVER THE COUNTER MEDICATION Gymnema Sylvestre - Patient states that he takes 1 a day for his diabetes   [DISCONTINUED] levothyroxine  (SYNTHROID ) 125 MCG tablet Take 1 tablet (125 mcg total) by mouth daily before breakfast.   levothyroxine  (SYNTHROID ) 125 MCG tablet Take 1 tablet (125 mcg total) by mouth daily before breakfast.   OneTouch Delica Lancets 30G MISC Use to check glucose twice daily   OVER THE COUNTER MEDICATION Sugar Defender - Patient takes 3 drops a day for his blood sugar   No facility-administered encounter medications on file as of 06/26/2024.    ALLERGIES: Allergies  Allergen Reactions   Semaglutide Diarrhea   VACCINATION STATUS: Immunization History  Administered Date(s) Administered   Pneumococcal Polysaccharide-23 07/24/2002, 09/23/2013   Tdap 11/01/2006     HPI  Diabetes He presents for his follow-up diabetic visit. He has type 2 diabetes mellitus. His disease course has been worsening. There are no hypoglycemic associated symptoms. There are no diabetic associated symptoms. There are no hypoglycemic complications. Diabetic complications include heart disease (hx CAD) and nephropathy. Risk factors for coronary artery disease include diabetes mellitus, dyslipidemia, male sex, obesity, hypertension and family history. Current diabetic treatment includes oral agent (dual therapy). He is compliant with treatment most of the time. His weight is fluctuating minimally. He is following a generally unhealthy diet. When asked about meal planning, he reported none. He has not had a previous visit with a dietitian. He participates in exercise intermittently. His home blood glucose trend is increasing steadily. His overall blood glucose range is >200 mg/dl. (He presents today, accompanied  by his wife, with his logs showing above target glycemic profile overall.  His POCT A1c today is 9.5%, increasing from last visit of 8.6%.  He admits his diet still needs work.  He eats only 2 meals per day skipping lunch and consumes high fat proteins like country ham and sausage.  He will have an occasional soda if he works too hard in the garden.  He has not had any drops in glucose since reducing his dose of Glipizide .  He does wake up with higher readings in the mornings than the night before (and does not snack).) An ACE inhibitor/angiotensin II receptor blocker is not being taken. He does not see a podiatrist.Eye exam is current.    [name]  is a patient with the above medical history. he was diagnosed with hypothyroidism at approximate age of 48 years, which required subsequent initiation of thyroid  hormone replacement therapy. he was given various doses of Levothyroxine  over the years, currently alternating between 2 doses per week 125 mcg on 5 days and 112 mcg on 2 days per week. he reports compliance to this medication:  Taking it daily on empty stomach with water, separated by >30 minutes before breakfast and other medications, and by at least 4 hours from calcium, iron, PPIs, multivitamins .  I reviewed patient's thyroid  tests:  Lab Results  Component Value Date   TSH 2.730 02/13/2024   TSH 8.430 (H) 11/14/2023   TSH 7.76 (A) 07/27/2023  TSH 5.61 04/18/2023   TSH 1.67 12/26/2022   TSH 10.37 (A) 09/28/2022   TSH 45.595 (H) 10/21/2018   TSH 2.373 09/23/2013   TSH 0.702 09/17/2012   FREET4 1.53 02/13/2024   FREET4 1.33 11/14/2023   FREET4 0.50 (L) 10/22/2018     He does note cold hands but no other overt symptoms of under-replacement.  Pt denies feeling nodules in neck, hoarseness, dysphagia/odynophagia, SOB with lying down.  he denies any known family history of thyroid  disorders.  No family history of thyroid  cancer.  No history of radiation therapy to head or neck.  No  recent use of iodine supplements. He does take 2 different supplements containing Biotin (B-Complex vitamin and MVI).  I reviewed his chart and he also has a history of CAD, GERD, DM.   ROS:  Constitutional: no weight gain/loss, no fatigue, no subjective hyperthermia, + subjective hypothermia (especially in hands) Eyes: no blurry vision, no xerophthalmia ENT: no sore throat, no nodules palpated in throat, no dysphagia/odynophagia, no hoarseness Cardiovascular: no chest pain, no SOB, no palpitations, no leg swelling Respiratory: no cough, no SOB Musculoskeletal: no muscle/joint aches Skin: no rashes Neurological: no tremors, no numbness, no tingling, no dizziness Psychiatric: no depression, no anxiety   Objective:   Objective     BP 122/64 (BP Location: Right Arm, Patient Position: Sitting, Cuff Size: Large)   Pulse 69   Ht 5' 11 (1.803 m)   Wt 235 lb 6.4 oz (106.8 kg)   BMI 32.83 kg/m  Wt Readings from Last 3 Encounters:  06/26/24 235 lb 6.4 oz (106.8 kg)  02/20/24 237 lb 9.6 oz (107.8 kg)  11/09/23 241 lb (109.3 kg)    BP Readings from Last 3 Encounters:  06/26/24 122/64  03/04/24 (!) 152/77  02/20/24 (!) 140/76      Physical Exam- Limited  Constitutional:  Body mass index is 32.83 kg/m. , not in acute distress, normal state of mind Eyes:  EOMI, no exophthalmos Musculoskeletal: no gross deformities, strength intact in all four extremities, no gross restriction of joint movements Skin:  no rashes, no hyperemia Neurological: no tremor with outstretched hands   CMP ( most recent) CMP     Component Value Date/Time   NA 133 (L) 10/23/2018 0602   K 3.6 10/23/2018 0602   CL 100 10/23/2018 0602   CO2 24 10/23/2018 0602   GLUCOSE 231 (H) 10/23/2018 0602   BUN 8 10/23/2018 0602   CREATININE 0.89 10/23/2018 0602   CREATININE 0.93 08/30/2014 0920   CALCIUM 8.4 (L) 10/23/2018 0602   PROT 7.1 10/20/2018 2002   ALBUMIN 3.8 10/20/2018 2002   AST 34 10/20/2018 2002    ALT 34 10/20/2018 2002   ALKPHOS 75 10/20/2018 2002   BILITOT 2.3 (H) 10/20/2018 2002   GFRNONAA >60 10/23/2018 0602     Diabetic Labs (most recent): Lab Results  Component Value Date   HGBA1C 9.5 (A) 06/26/2024   HGBA1C 11.8 (H) 10/20/2018   HGBA1C 8.1 08/30/2014     Lipid Panel ( most recent) Lipid Panel     Component Value Date/Time   CHOL 231 (H) 08/30/2014 0920   TRIG 106 08/30/2014 0920   HDL 47 08/30/2014 0920   CHOLHDL 4.9 08/30/2014 0920   VLDL 21 08/30/2014 0920   LDLCALC 163 (H) 08/30/2014 0920       Lab Results  Component Value Date   TSH 2.730 02/13/2024   TSH 8.430 (H) 11/14/2023   TSH 7.76 (A) 07/27/2023  TSH 5.61 04/18/2023   TSH 1.67 12/26/2022   TSH 10.37 (A) 09/28/2022   TSH 45.595 (H) 10/21/2018   TSH 2.373 09/23/2013   TSH 0.702 09/17/2012   FREET4 1.53 02/13/2024   FREET4 1.33 11/14/2023   FREET4 0.50 (L) 10/22/2018     Latest Reference Range & Units 09/28/22 00:00 12/26/22 00:00 04/18/23 00:00 07/27/23 00:00 11/14/23 11:28 02/13/24 13:06    Latest Reference Range & Units 09/28/22 00:00 12/26/22 00:00 04/18/23 00:00 07/27/23 00:00 11/14/23 11:28 02/13/24 13:06  TSH 0.450 - 4.500 uIU/mL 10.37 ! (E) 1.67 (E) 5.61 (E) 7.76 ! (E) 8.430 (H) 2.730  T4,Free(Direct) 0.82 - 1.77 ng/dL     8.66 8.46  Thyroperoxidase Ab SerPl-aCnc 0 - 34 IU/mL     105 (H)   Thyroglobulin Antibody 0.0 - 0.9 IU/mL     5.2 (H)   !: Data is abnormal (H): Data is abnormally high (E): External lab result  Assessment & Plan:   ASSESSMENT / PLAN:  1. 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  There are no recent TFTs to review.  He is advised to continue his current regimen with Levothyroxine  125 mcg daily before breakfast.  Will recheck TFTs prior to next visit and adjust dose accordingly.  - We discussed about correct intake of levothyroxine , at fasting, with  water, separated by at least 30 minutes from breakfast, and separated by more than 4 hours from calcium, iron, multivitamins, acid reflux medications (PPIs). -Patient is made aware of the fact that thyroid  hormone replacement is needed for life, dose to be adjusted by periodic monitoring of thyroid  function tests.  2. Type 2 diabetes with nephropathy  He presents today, accompanied by his wife, with his logs showing above target glycemic profile overall.  His POCT A1c today is 9.5%, increasing from last visit of 8.6%.  He admits his diet still needs work.  He eats only 2 meals per day skipping lunch and consumes high fat proteins like country ham and sausage.  He will have an occasional soda if he works too hard in the garden.  He has not had any drops in glucose since reducing his dose of Glipizide .  He does wake up with higher readings in the mornings than the night before (and does not snack).  -Recent labs reviewed.  The following Lifestyle Medicine recommendations according to American College of Lifestyle Medicine [facility]) were discussed and offered to patient and he agrees to start the journey:  A. Whole Foods, Plant-based plate comprising of fruits and vegetables, plant-based proteins, whole-grain carbohydrates was discussed in detail with the patient.   A list for source of those nutrients were also provided to the patient.  Patient will use only water or unsweetened tea for hydration. B.  The need to stay away from risky substances including alcohol, smoking; obtaining 7 to 9 hours of restorative sleep, at least 150 minutes of moderate intensity exercise weekly, the importance of healthy social connections,  and stress reduction techniques were discussed. C.  A full color page of  Calorie density of various food groups per pound showing examples of each food groups was provided to the patient.  - Nutritional counseling repeated at each appointment due to patients tendency to fall back in to old  habits.  - The patient admits there is a room for improvement in their diet and drink choices. -  Suggestion is made for the patient to avoid simple carbohydrates from their diet  including Cakes, Sweet Desserts / Pastries, Ice Cream, Soda (diet and regular), Sweet Tea, Candies, Chips, Cookies, Sweet Pastries, Store Bought Juices, Alcohol in Excess of 1-2 drinks a day, Artificial Sweeteners, Coffee Creamer, and Sugar-free Products. This will help patient to have stable blood glucose profile and potentially avoid unintended weight gain.   - I encouraged the patient to switch to unprocessed or minimally processed complex starch and increased protein intake (animal or plant source), fruits, and vegetables.   - Patient is advised to stick to a routine mealtimes to eat 3 meals a day and avoid unnecessary snacks (to snack only to correct hypoglycemia).  He is advised to continue Glipizide  5 mg XL daily and Metformin  1000 mg po twice daily after meals.  Will give him more time to get diet back on track before considering to add another agent.  He is aware that if A1c has not improved, will certainly add another medication to help.  He prefers to stay away from injectables.  He is advised to check glucose at least once daily, before breakfast and to reach out if glucose is less than 70 or above 300 for 3 tests in a row.     I spent  38  minutes in the care of the patient today including review of labs from CMP, Lipids, Thyroid  Function, Hematology (current and previous including abstractions from other facilities); face-to-face time discussing  his blood glucose readings/logs, discussing hypoglycemia and hyperglycemia episodes and symptoms, medications doses, his options of short and long term treatment based on the latest standards of care / guidelines;  discussion about incorporating lifestyle medicine;  and documenting the encounter. Risk reduction counseling performed per USPSTF guidelines to reduce  obesity and cardiovascular risk factors.     Please refer to Patient Instructions for Blood Glucose Monitoring and Insulin /Medications Dosing Guide  in media tab for additional information. Please  also refer to  Patient Self Inventory in the Media  tab for reviewed elements of pertinent patient history.  [name] participated in the discussions, expressed understanding, and voiced agreement with the above plans.  All questions were answered to his satisfaction. he is encouraged to contact clinic should he have any questions or concerns prior to his return visit.   FOLLOW UP PLAN:  Return in about 3 months (around 09/25/2024) for Diabetes F/U- A1c and UM in office, Thyroid  follow up, Previsit labs, Bring meter and logs.  Benton Rio, [facility] [address] [place], [place] [address] Phone: [phone] Fax: [phone]  06/26/2024, 8:35 AM

## 2024-07-09 ENCOUNTER — Encounter: Payer: Self-pay | Admitting: Gastroenterology

## 2024-09-24 LAB — COMPREHENSIVE METABOLIC PANEL WITH GFR
ALT: 14 IU/L (ref 0–44)
AST: 19 IU/L (ref 0–40)
Albumin: 4.4 g/dL (ref 3.8–4.8)
Alkaline Phosphatase: 72 IU/L (ref 47–123)
BUN/Creatinine Ratio: 21 (ref 10–24)
BUN: 18 mg/dL (ref 8–27)
Bilirubin Total: 0.6 mg/dL (ref 0.0–1.2)
CO2: 21 mmol/L (ref 20–29)
Calcium: 9.7 mg/dL (ref 8.6–10.2)
Chloride: 100 mmol/L (ref 96–106)
Creatinine, Ser: 0.87 mg/dL (ref 0.76–1.27)
Globulin, Total: 2.8 g/dL (ref 1.5–4.5)
Glucose: 171 mg/dL — ABNORMAL HIGH (ref 70–99)
Potassium: 4.7 mmol/L (ref 3.5–5.2)
Sodium: 138 mmol/L (ref 134–144)
Total Protein: 7.2 g/dL (ref 6.0–8.5)
eGFR: 90 mL/min/1.73 (ref >59)

## 2024-09-24 LAB — TSH: TSH: 3.58 u[IU]/mL (ref 0.450–4.500)

## 2024-09-24 LAB — T4, FREE: Free T4: 1.48 ng/dL (ref 0.82–1.77)

## 2024-09-25 ENCOUNTER — Encounter: Payer: Self-pay | Admitting: Nurse Practitioner

## 2024-09-25 ENCOUNTER — Ambulatory Visit: Admitting: Nurse Practitioner

## 2024-09-25 VITALS — BP 124/70 | HR 64 | Ht 71.0 in | Wt 228.6 lb

## 2024-09-25 DIAGNOSIS — Z7984 Long term (current) use of oral hypoglycemic drugs: Secondary | ICD-10-CM

## 2024-09-25 DIAGNOSIS — E1121 Type 2 diabetes mellitus with diabetic nephropathy: Secondary | ICD-10-CM | POA: Diagnosis not present

## 2024-09-25 DIAGNOSIS — E063 Autoimmune thyroiditis: Secondary | ICD-10-CM | POA: Diagnosis not present

## 2024-09-25 LAB — POCT GLYCOSYLATED HEMOGLOBIN (HGB A1C): Hemoglobin A1C: 8.6 % — AB (ref 4.0–5.6)

## 2024-09-25 LAB — POCT UA - MICROALBUMIN
Albumin/Creatinine Ratio, Urine, POC: <30
Microalbumin Ur, POC: 30 mg/L

## 2024-09-25 MED ORDER — METFORMIN HCL 500 MG PO TABS: 1000.0000 mg | ORAL_TABLET | Freq: Two times a day (BID) | ORAL | 3 refills | Status: AC

## 2024-09-25 MED ORDER — ONETOUCH ULTRA TEST VI STRP: ORAL_STRIP | 12 refills | Status: AC

## 2024-09-25 MED ORDER — ONETOUCH ULTRASOFT LANCETS MISC: 12 refills | Status: AC

## 2024-09-25 MED ORDER — LEVOTHYROXINE SODIUM 125 MCG PO TABS: 125.0000 ug | ORAL_TABLET | Freq: Every day | ORAL | 1 refills | Status: AC

## 2024-09-25 MED ORDER — GLIPIZIDE ER 5 MG PO TB24: 5.0000 mg | ORAL_TABLET | Freq: Every day | ORAL | 3 refills | Status: AC

## 2024-09-25 NOTE — Patient Instructions (Signed)

## 2024-09-25 NOTE — Progress Notes (Signed)
 Endocrinology Follow Up Note                                         09/25/2024, 8:56 AM  Subjective:   Subjective    [name] is a 73 y.o.-year-old male patient being seen in follow up after being seen in consultation for hypothyroidism referred by Lynwood Laneta ORN, PA-C.  He and his wife were under the impression that he was to be seen here for his diabetes, but the referral was just for hypothyroidism.  PCP noted they would also like him to be seen for diabetes as well.   Past Medical History:  Diagnosis Date   Arthritis    shoulders, right ankle (10/22/2018)   Cataract    Coronary artery disease    tandem 50/60% lesions in the LAD a 20% lesion the RCA catheterization 2004   Degenerative disc disease    Diet-controlled type 2 diabetes mellitus (HCC)    dx'd 10/20/2018   Glucose intolerance (impaired glucose tolerance)    History of kidney stones    HLD (hyperlipidemia)    hx   Hypothyroidism    Impaired hearing    Sebaceous cyst    neck   Thyroid  disease    not on meds   Tinnitus, right ear    Varicose veins     Past Surgical History:  Procedure Laterality Date   CARDIAC CATHETERIZATION     COLONOSCOPY  2011   CYST EXCISION Right 09/07/2018   Procedure: EXCISION OF RECURRENT SEBACEOUS CYST OF NECK;  Surgeon: Gladis Cough, MD;  Location: [facility];  Service: General;  Laterality: Right;  posterior   FRACTURE SURGERY     JOINT REPLACEMENT     MULTIPLE TOOTH EXTRACTIONS  1970   all teeth removed    REPAIR PERONEAL TENDONS ANKLE Right    REVISION TOTAL KNEE ARTHROPLASTY Right    SHOULDER HARDWARE REMOVAL Left    took screw out   SHOULDER SURGERY Bilateral    right: fell,  put all ligaments back together, put pin in; left: MVI, dislocated, put it back together, healed, took screw out   TOTAL KNEE ARTHROPLASTY Bilateral     Social History   Socioeconomic History    Marital status: Married    Spouse name: Not on file   Number of children: Not on file   Years of education: Not on file   Highest education level: Not on file  Occupational History   Not on file  Tobacco Use   Smoking status: Never   Smokeless tobacco: Never  Vaping Use   Vaping status: Never Used  Substance and Sexual Activity   Alcohol use: Never   Drug use: Never   Sexual activity: Not Currently  Other Topics Concern   Not on file  Social History Narrative   Not on file   Social Drivers of Health   Financial Resource Strain: Low Risk  (12/02/2021)   Received from [facility] visits  prior to 12/24/2022., [facility]   Overall Financial Resource Strain (CARDIA)    Difficulty of Paying Living Expenses: Not hard at all  Food Insecurity: Low Risk  (01/19/2024)   Received from [facility]   Hunger Vital Sign    Within the past 12 months, you worried that your food would run out before you got money to buy more: Never true    Within the past 12 months, the food you bought just didn't last and you didn't have money to get more. : Never true  Transportation Needs: No Transportation Needs (01/19/2024)   Received from [employer]    In the past 12 months, has lack of reliable transportation kept you from medical appointments, meetings, work or from getting things needed for daily living? : No  Physical Activity: Unknown (12/02/2021)   Received from [facility] prior to 12/24/2022., [facility]   Exercise Vital Sign    On average, how many days per week do you engage in moderate to strenuous exercise (like a brisk walk)?: 0 days    Minutes of Exercise per Session: Not on file  Stress: No Stress Concern Present (12/02/2021)   [facility] prior to 12/24/2022., [facility]   [employer] of Occupational Health - Occupational Stress Questionnaire    Feeling of Stress : Not at  all  Social Connections: Socially Integrated (12/02/2021)   [facility] prior to 12/24/2022., [facility]   Social Connection and Isolation Panel    In a typical week, how many times do you talk on the phone with family, friends, or neighbors?: Once a week    How often do you get together with friends or relatives?: Three times a week    How often do you attend church or religious services?: More than 4 times per year    Do you belong to any clubs or organizations such as church groups, unions, fraternal or athletic groups, or school groups?: Yes    How often do you attend meetings of the clubs or organizations you belong to?: More than 4 times per year    Are you married, widowed, divorced, separated, never married, or living with a partner?: Married    Family History  Problem Relation Age of Onset   Heart attack Brother        triple bypass   Diabetes Brother    Heart block Brother    Diabetes Brother    Diabetes Mother    Diabetes Father    Diabetes Other        siblings   Heart defect Other        both parents   Colon cancer Neg Hx    Colon polyps Neg Hx    Esophageal cancer Neg Hx    Rectal cancer Neg Hx    Stomach cancer Neg Hx     Outpatient Encounter Medications as of 09/25/2024  Medication Sig   aspirin 81 MG chewable tablet Chew by mouth daily.   b complex vitamins capsule Take 1 capsule by mouth daily.   blood glucose meter kit and supplies Dispense based on patient and insurance preference. Use up to four times daily as directed. (FOR ICD-10 E10.9, E11.9).   fluticasone  (FLONASE ) 50 MCG/ACT nasal spray SPRAY 1 SPRAY INTO EACH NOSTRIL EVERY DAY   glucose blood ([employer] BLOOD GLUCOSE TEST) test strip Accu Chek Guide strips (#200) and  the Accu Chek Fast Clix  Lancets (#200) tests BID Diagnoses Code: E11.65   Lancets (ONETOUCH ULTRASOFT) lancets Use as instructed to monitor glucose twice daily   OVER THE COUNTER MEDICATION Gymnema  Sylvestre - Patient states that he takes 1 a day for his diabetes   OVER THE COUNTER MEDICATION Sugar Defender - Patient takes 3 drops a day for his blood sugar   [DISCONTINUED] glipiZIDE  (GLUCOTROL  XL) 5 MG 24 hr tablet Take 1 tablet (5 mg total) by mouth daily with breakfast.   [DISCONTINUED] glucose blood (ONETOUCH ULTRA TEST) test strip Use as instructed to monitor glucose twice daily   [DISCONTINUED] levothyroxine  (SYNTHROID ) 125 MCG tablet Take 1 tablet (125 mcg total) by mouth daily before breakfast.   [DISCONTINUED] metFORMIN  (GLUCOPHAGE ) 500 MG tablet Take 2 tablets (1,000 mg total) by mouth 2 (two) times daily with a meal.   [DISCONTINUED] OneTouch Delica Lancets 30G MISC Use to check glucose twice daily   glipiZIDE  (GLUCOTROL  XL) 5 MG 24 hr tablet Take 1 tablet (5 mg total) by mouth daily with breakfast.   glucose blood (ONETOUCH ULTRA TEST) test strip Use as instructed to monitor glucose twice daily   levothyroxine  (SYNTHROID ) 125 MCG tablet Take 1 tablet (125 mcg total) by mouth daily before breakfast.   metFORMIN  (GLUCOPHAGE ) 500 MG tablet Take 2 tablets (1,000 mg total) by mouth 2 (two) times daily with a meal.   No facility-administered encounter medications on file as of 09/25/2024.    ALLERGIES: Allergies  Allergen Reactions   Semaglutide Diarrhea   VACCINATION STATUS: Immunization History  Administered Date(s) Administered   Pneumococcal Polysaccharide-23 07/24/2002, 09/23/2013   Tdap 11/01/2006     HPI  Diabetes He presents for his follow-up diabetic visit. He has type 2 diabetes mellitus. His disease course has been improving. There are no hypoglycemic associated symptoms. There are no diabetic associated symptoms. There are no hypoglycemic complications. Diabetic complications include heart disease (hx CAD) and nephropathy. Risk factors for coronary artery disease include diabetes mellitus, dyslipidemia, male sex, obesity, hypertension and family history. Current  diabetic treatment includes oral agent (dual therapy). He is compliant with treatment most of the time. His weight is fluctuating minimally. He is following a generally unhealthy diet. When asked about meal planning, he reported none. He has not had a previous visit with a dietitian. He participates in exercise intermittently. His home blood glucose trend is decreasing steadily. His breakfast blood glucose range is generally 140-180 mg/dl. His bedtime blood glucose range is generally 140-180 mg/dl. His overall blood glucose range is 140-180 mg/dl. (He presents today, accompanied by his wife, with his logs showing improving glycemic profile with near target fasting and slightly above target evening readings.  His POCT A1c today is 8.6%, improving from last visit of 9.5%. He is working on his diet, will cheat from time to time but marks it down on his log sheets when he does.  He denies any hypoglycemia.) An ACE inhibitor/angiotensin II receptor blocker is not being taken. He does not see a podiatrist.Eye exam is current.    [name]  is a patient with the above medical history. he was diagnosed with hypothyroidism at approximate age of 48 years, which required subsequent initiation of thyroid  hormone replacement therapy. he was given various doses of Levothyroxine  over the years, currently on 125 mcg po daily. he reports compliance to this medication:  Taking it daily on empty stomach with water, separated by >30 minutes before breakfast and other medications, and  by at least 4 hours from calcium, iron, PPIs, multivitamins .  I reviewed patient's thyroid  tests:  Lab Results  Component Value Date   TSH 3.580 09/23/2024   TSH 2.730 02/13/2024   TSH 8.430 (H) 11/14/2023   TSH 7.76 (A) 07/27/2023   TSH 5.61 04/18/2023   TSH 1.67 12/26/2022   TSH 10.37 (A) 09/28/2022   TSH 45.595 (H) 10/21/2018   TSH 2.373 09/23/2013   TSH 0.702 09/17/2012   FREET4 1.48 09/23/2024   FREET4 1.53 02/13/2024    FREET4 1.33 11/14/2023   FREET4 0.50 (L) 10/22/2018     He does note cold hands but no other overt symptoms of under-replacement.  Pt denies feeling nodules in neck, hoarseness, dysphagia/odynophagia, SOB with lying down.  he denies any known family history of thyroid  disorders.  No family history of thyroid  cancer.  No history of radiation therapy to head or neck.  No recent use of iodine supplements. He does take 2 different supplements containing Biotin (B-Complex vitamin and MVI).  I reviewed his chart and he also has a history of CAD, GERD, DM.   Review of systems  Constitutional: + decreasing body weight,  current Body mass index is 31.88 kg/m. , no fatigue, no subjective hyperthermia, no subjective hypothermia- does have cold hands and feet (raynaud's) Eyes: no blurry vision, no xerophthalmia ENT: no sore throat, no nodules palpated in throat, no dysphagia/odynophagia, no hoarseness Cardiovascular: no chest pain, no shortness of breath, no palpitations, no leg swelling Respiratory: no cough, no shortness of breath Gastrointestinal: no nausea/vomiting/diarrhea Musculoskeletal: no muscle/joint aches Skin: no rashes, no hyperemia Neurological: no tremors, no numbness, no tingling, no dizziness Psychiatric: no depression, no anxiety   Objective:   Objective     BP 124/70 (BP Location: Left Arm, Patient Position: Sitting, Cuff Size: Large)   Pulse 64   Ht 5' 11 (1.803 m)   Wt 228 lb 9.6 oz (103.7 kg)   BMI 31.88 kg/m  Wt Readings from Last 3 Encounters:  09/25/24 228 lb 9.6 oz (103.7 kg)  06/26/24 235 lb 6.4 oz (106.8 kg)  02/20/24 237 lb 9.6 oz (107.8 kg)    BP Readings from Last 3 Encounters:  09/25/24 124/70  06/26/24 122/64  03/04/24 (!) 152/77      Physical Exam- Limited  Constitutional:  Body mass index is 31.88 kg/m. , not in acute distress, normal state of mind Eyes:  EOMI, no exophthalmos Musculoskeletal: no gross deformities, strength intact in  all four extremities, no gross restriction of joint movements Skin:  no rashes, no hyperemia Neurological: no tremor with outstretched hands   CMP ( most recent) CMP     Component Value Date/Time   NA 138 09/23/2024 0915   K 4.7 09/23/2024 0915   CL 100 09/23/2024 0915   CO2 21 09/23/2024 0915   GLUCOSE 171 (H) 09/23/2024 0915   GLUCOSE 231 (H) 10/23/2018 0602   BUN 18 09/23/2024 0915   CREATININE 0.87 09/23/2024 0915   CREATININE 0.93 08/30/2014 0920   CALCIUM 9.7 09/23/2024 0915   PROT 7.2 09/23/2024 0915   ALBUMIN 4.4 09/23/2024 0915   AST 19 09/23/2024 0915   ALT 14 09/23/2024 0915   ALKPHOS 72 09/23/2024 0915   BILITOT 0.6 09/23/2024 0915   EGFR 90 09/23/2024 0915   GFRNONAA >60 10/23/2018 0602     Diabetic Labs (most recent): Lab Results  Component Value Date   HGBA1C 8.6 (A) 09/25/2024   HGBA1C 9.5 (A) 06/26/2024   HGBA1C  11.8 (H) 10/20/2018   MICROALBUR 30 mg/L 09/25/2024     Lipid Panel ( most recent) Lipid Panel     Component Value Date/Time   CHOL 231 (H) 08/30/2014 0920   TRIG 106 08/30/2014 0920   HDL 47 08/30/2014 0920   CHOLHDL 4.9 08/30/2014 0920   VLDL 21 08/30/2014 0920   LDLCALC 163 (H) 08/30/2014 0920       Lab Results  Component Value Date   TSH 3.580 09/23/2024   TSH 2.730 02/13/2024   TSH 8.430 (H) 11/14/2023   TSH 7.76 (A) 07/27/2023   TSH 5.61 04/18/2023   TSH 1.67 12/26/2022   TSH 10.37 (A) 09/28/2022   TSH 45.595 (H) 10/21/2018   TSH 2.373 09/23/2013   TSH 0.702 09/17/2012   FREET4 1.48 09/23/2024   FREET4 1.53 02/13/2024   FREET4 1.33 11/14/2023   FREET4 0.50 (L) 10/22/2018      Latest Reference Range & Units 07/27/23 00:00 11/14/23 11:28 02/13/24 13:06 09/23/24 09:15  TSH 0.450 - 4.500 uIU/mL 7.76 ! (E) 8.430 (H) 2.730 3.580  T4,Free(Direct) 0.82 - 1.77 ng/dL  8.66 8.46 8.51  Thyroperoxidase Ab SerPl-aCnc 0 - 34 IU/mL  105 (H)    Thyroglobulin Antibody 0.0 - 0.9 IU/mL  5.2 (H)    !: Data is abnormal (H): Data is  abnormally high (E): External lab result  Assessment & Plan:   ASSESSMENT / PLAN:  1. 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  His previsit TFTs are consistent with appropriate hormone replacement.  He is advised to continue his current regimen with Levothyroxine  125 mcg daily before breakfast.  Will recheck TFTs prior to next visit and adjust dose accordingly.   - The correct intake of thyroid  hormone (Levothyroxine , Synthroid ), is on empty stomach first thing in the morning, with water, separated by at least 30 minutes from breakfast and other medications,  and separated by more than 4 hours from calcium, iron, multivitamins, acid reflux medications (PPIs).  - This medication is a life-long medication and will be needed to correct thyroid  hormone imbalances for the rest of your life.  The dose may change from time to time, based on thyroid  blood work.  - It is extremely important to be consistent taking this medication, near the same time each morning.  -AVOID TAKING PRODUCTS CONTAINING BIOTIN (commonly found in Hair, Skin, Nails vitamins) AS IT INTERFERES WITH THE VALIDITY OF THYROID  FUNCTION BLOOD TESTS.  2. Type 2 diabetes with nephropathy  He presents today, accompanied by his wife, with his logs showing improving glycemic profile with near target fasting and slightly above target evening readings.  His POCT A1c today is 8.6%, improving from last visit of 9.5%. He is working on his diet, will cheat from time to time but marks it down on his log sheets when he does.  He denies any hypoglycemia.  -Recent labs reviewed.  The following Lifestyle Medicine recommendations according to American College of Lifestyle Medicine [employer]) were discussed and offered to patient and he agrees to start the journey:  A. Whole Foods, Plant-based plate comprising of fruits and vegetables, plant-based  proteins, whole-grain carbohydrates was discussed in detail with the patient.   A list for source of those nutrients were also provided to the patient.  Patient will use only water or unsweetened tea for hydration. B.  The need to stay away from risky substances including alcohol, smoking; obtaining 7 to 9 hours of restorative sleep, at least  150 minutes of moderate intensity exercise weekly, the importance of healthy social connections,  and stress reduction techniques were discussed. C.  A full color page of  Calorie density of various food groups per pound showing examples of each food groups was provided to the patient.  - Nutritional counseling repeated/built upon at each appointment.  - The patient admits there is a room for improvement in their diet and drink choices. -  Suggestion is made for the patient to avoid simple carbohydrates from their diet including Cakes, Sweet Desserts / Pastries, Ice Cream, Soda (diet and regular), Sweet Tea, Candies, Chips, Cookies, Sweet Pastries, Store Bought Juices, Alcohol in Excess of 1-2 drinks a day, Artificial Sweeteners, Coffee Creamer, and Sugar-free Products. This will help patient to have stable blood glucose profile and potentially avoid unintended weight gain.   - I encouraged the patient to switch to unprocessed or minimally processed complex starch and increased protein intake (animal or plant source), fruits, and vegetables.   - Patient is advised to stick to a routine mealtimes to eat 3 meals a day and avoid unnecessary snacks (to snack only to correct hypoglycemia).  Based on his improving glycemic profile, no changes will be made to his regimen today.  He is advised to continue Glipizide  5 mg XL daily and Metformin  1000 mg po twice daily after meals.  If A1c does not continue to improve at next visit, will consider adding another agent (prefers not injectables).  He is advised to check glucose at least once daily, before breakfast and to  reach out if glucose is less than 70 or above 300 for 3 tests in a row.      I spent  50  minutes in the care of the patient today including review of labs from CMP, Lipids, Thyroid  Function, Hematology (current and previous including abstractions from other facilities); face-to-face time discussing  his blood glucose readings/logs, discussing hypoglycemia and hyperglycemia episodes and symptoms, medications doses, his options of short and long term treatment based on the latest standards of care / guidelines;  discussion about incorporating lifestyle medicine;  and documenting the encounter. Risk reduction counseling performed per USPSTF guidelines to reduce obesity and cardiovascular risk factors.     Please refer to Patient Instructions for Blood Glucose Monitoring and Insulin /Medications Dosing Guide  in media tab for additional information. Please  also refer to  Patient Self Inventory in the Media  tab for reviewed elements of pertinent patient history.  [name] participated in the discussions, expressed understanding, and voiced agreement with the above plans.  All questions were answered to his satisfaction. he is encouraged to contact clinic should he have any questions or concerns prior to his return visit.   FOLLOW UP PLAN:  Return in about 3 months (around 12/24/2024) for Diabetes F/U with A1c in office, No previsit labs, Bring meter and logs, Thyroid  follow up.  Benton Rio, [facility] [address] [place], [place] [address] Phone: [phone] Fax: [phone]  09/25/2024, 8:56 AM

## 2024-12-26 ENCOUNTER — Ambulatory Visit: Admitting: Nurse Practitioner
